# Patient Record
Sex: Male | Born: 1995 | Race: Black or African American | Hispanic: No | Marital: Single | Smoking: Former smoker
Health system: Southern US, Community
[De-identification: ages and names within clinical notes are randomized; demographics above are authoritative.]

## PROBLEM LIST (undated history)

## (undated) DIAGNOSIS — J189 Pneumonia, unspecified organism: Secondary | ICD-10-CM

## (undated) DIAGNOSIS — K226 Gastro-esophageal laceration-hemorrhage syndrome: Secondary | ICD-10-CM

## (undated) HISTORY — DX: Gastro-esophageal laceration-hemorrhage syndrome: K22.6

---
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
## 2016-07-29 ENCOUNTER — Encounter (HOSPITAL_COMMUNITY): Payer: Self-pay

## 2016-07-29 ENCOUNTER — Emergency Department (HOSPITAL_COMMUNITY)
Admission: EM | Admit: 2016-07-29 | Discharge: 2016-07-29 | Disposition: A | Discharge status: Home / Self Care | Payer: Medicaid Other | Attending: Emergency Medicine | Admitting: Emergency Medicine

## 2016-07-29 DIAGNOSIS — K648 Other hemorrhoids: Secondary | ICD-10-CM | POA: Diagnosis not present

## 2016-07-29 DIAGNOSIS — K6289 Other specified diseases of anus and rectum: Secondary | ICD-10-CM | POA: Diagnosis present

## 2016-07-29 NOTE — Discharge Instructions (Signed)
It was my pleasure taking care of you today!   Increase hydration and follow a high fiber diet. Use a stool softener such as Colace to help relieve pain with bowel movements. Sitz baths and Epsom salt baths will also help improve symptoms.   If symptoms persist, please call the surgery clinic listed to schedule a follow up appointment. Please return to ER for new or worsening symptoms, any additional concerns.

## 2016-07-29 NOTE — ED Triage Notes (Signed)
Patient complains of hemorrhoidal pain x 2 weeks with bowel movement, states that they are internal. No bleeding, no constipation, NAD
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
## 2016-07-29 NOTE — ED Provider Notes (Signed)
[facility]-EMERGENCY DEPT Provider Note   CSN: [id number] Arrival date & time: 07/29/16  4098  By signing my name below, I, Joshua Leon, attest that this documentation has been prepared under the direction and in the presence of [facility], PA-C.  Electronically Signed: Rosario Leon, ED Scribe. 07/29/16. 10:52 AM.  History   Chief Complaint Chief Complaint  Patient presents with  . Hemorrhoids   The history is provided by the patient. No language interpreter was used.    HPI Comments: Joshua Leon is a 21 y.o. male with no pertinent PMHx, who presents to the Emergency Department complaining of intermittent, gradually worsening rectal pain beginning two weeks ago. Pt reports that he first noticed his pain two weeks ago during a bowel movement. Pt does not have a h/o hemorrhoids; however, was seen at the health department where they informed him that this may possibly be related to this. During this previous encounter he did have full STD check including rectal swab which was all normal at that time per pt. He has been taking Tylenol with temporary relief of his pain. He has also been using stool softeners with improvement of his pain with BM's as well. His pain is exacerbated with bowel movements, but there have not been any recent change in bowel habits. He has not had to increase in strain to pass stool since the onset of his symptoms. Pt denies hematochezia, anal bleeding, fever, or any other associated symptoms.   History reviewed. No pertinent past medical history.  There are no active problems to display for this patient.  History reviewed. No pertinent surgical history.  Home Medications    Prior to Admission medications   Medication Sig Start Date End Date Taking? Authorizing Provider  clotrimazole (LOTRIMIN) 1 % cream Apply to affected area 3 times daily 02/06/15   Lowanda Foster, NP  cyclobenzaprine (FLEXERIL) 10 MG tablet Take 1 tablet (10 mg total) by mouth 2 (two) times  daily as needed for muscle spasms. 07/31/15   Nelva Nay, MD  HYDROcodone-acetaminophen (NORCO/VICODIN) 5-325 MG tablet Take 2 tablets by mouth every 4 (four) hours as needed. 07/31/15   Nelva Nay, MD  ondansetron (ZOFRAN) 4 MG tablet Take 1-2 tablets (4-8 mg total) by mouth every 8 (eight) hours as needed for nausea or vomiting. 03/25/15   Ozella Rocks, MD   Family History Family History  Problem Relation Age of Onset  . Cancer Neg Hx   . Diabetes Neg Hx   . Heart failure Neg Hx   . Hyperlipidemia Neg Hx    Social History Social History  Substance Use Topics  . Smoking status: Never Smoker  . Smokeless tobacco: Never Used  . Alcohol use Yes     Comment: ocassionally   Allergies   Patient has no known allergies.  Review of Systems Review of Systems  Constitutional: Negative for fever.  Gastrointestinal: Positive for rectal pain. Negative for anal bleeding, blood in stool, constipation and diarrhea.   Physical Exam Updated Vital Signs BP 150/78   Pulse (!) 57   Temp 98.6 F (37 C) (Oral)   Resp 18   SpO2 97%   Physical Exam  Constitutional: He is oriented to person, place, and time. He appears well-developed and well-nourished. No distress.  HENT:  Head: Normocephalic and atraumatic.  Cardiovascular: Normal rate, regular rhythm and normal heart sounds.   No murmur heard. Pulmonary/Chest: Effort normal and breath sounds normal. No respiratory distress.  Abdominal: Soft. He exhibits no  distension. There is no tenderness.  Genitourinary:  Genitourinary Comments: Chaperone present throughout entire exam. +internal hemorrhoid, no signs of anal fissures or other lesions.   Musculoskeletal: He exhibits no edema.  Neurological: He is alert and oriented to person, place, and time.  Skin: Skin is warm and dry.  Nursing note and vitals reviewed.  ED Treatments / Results  DIAGNOSTIC STUDIES: Oxygen Saturation is 98% on RA, normal by my interpretation.   COORDINATION  OF CARE: 10:52 AM-Discussed next steps with pt. Pt verbalized understanding and is agreeable with the plan.   Labs (all labs ordered are listed, but only abnormal results are displayed) Labs Reviewed - No data to display  EKG  EKG Interpretation None      Radiology No results found.  Procedures Procedures   Medications Ordered in ED Medications - No data to display  Initial Impression / Assessment and Plan / ED Course  I have reviewed the triage vital signs and the nursing notes.  Pertinent labs & imaging results that were available during my care of the patient were reviewed by me and considered in my medical decision making (see chart for details).    Joshua HurstDana Leon is a 21 y.o. male who presents to ED for Rectal pain related to internal hemorrhoid. Internal hemorrhoid noted on exam. Patient has already had prior full STD screening two weeks ago including rectal swab which was all normal per patient. Home care instructions including sitz baths, continue stool softener discussed with patient at bedside. General surgery follow-up if symptoms persist despite symptomatic care. Return precautions discussed and all questions answered.  Final Clinical Impressions(s) / ED Diagnoses   Final diagnoses:  Internal hemorrhoid   New Prescriptions Discharge Medication List as of 07/29/2016 10:56 AM     I personally performed the services described in this documentation, which was scribed in my presence. The recorded information has been reviewed and is accurate.     [facility] Cottage HospitalJaime Pilcher Ward, PA-C 07/29/16 1125    Linwood DibblesJon Knapp, MD 07/29/16 [phone]

## 2016-11-23 ENCOUNTER — Emergency Department (HOSPITAL_COMMUNITY)
Admission: EM | Admit: 2016-11-23 | Discharge: 2016-11-23 | Disposition: A | Discharge status: Home / Self Care | Payer: Medicaid Other | Attending: Emergency Medicine | Admitting: Emergency Medicine

## 2016-11-23 ENCOUNTER — Emergency Department (HOSPITAL_COMMUNITY): Payer: Medicaid Other

## 2016-11-23 ENCOUNTER — Encounter (HOSPITAL_COMMUNITY): Payer: Self-pay | Admitting: Emergency Medicine

## 2016-11-23 DIAGNOSIS — S3992XA Unspecified injury of lower back, initial encounter: Secondary | ICD-10-CM | POA: Diagnosis present

## 2016-11-23 DIAGNOSIS — T304 Corrosion of unspecified body region, unspecified degree: Secondary | ICD-10-CM | POA: Diagnosis not present

## 2016-11-23 DIAGNOSIS — Y9389 Activity, other specified: Secondary | ICD-10-CM | POA: Diagnosis not present

## 2016-11-23 DIAGNOSIS — Y92410 Unspecified street and highway as the place of occurrence of the external cause: Secondary | ICD-10-CM | POA: Diagnosis not present

## 2016-11-23 DIAGNOSIS — Y999 Unspecified external cause status: Secondary | ICD-10-CM | POA: Diagnosis not present

## 2016-11-23 DIAGNOSIS — S161XXA Strain of muscle, fascia and tendon at neck level, initial encounter: Secondary | ICD-10-CM | POA: Diagnosis not present

## 2016-11-23 DIAGNOSIS — S39012A Strain of muscle, fascia and tendon of lower back, initial encounter: Secondary | ICD-10-CM | POA: Diagnosis not present

## 2016-11-23 MED ORDER — HYDROCODONE-ACETAMINOPHEN 5-325 MG PO TABS: ORAL_TABLET | ORAL | 0 refills | Status: DC

## 2016-11-23 MED ORDER — LIDOCAINE VISCOUS 2 % MT SOLN: OROMUCOSAL | 0 refills | Status: DC

## 2016-11-23 MED ORDER — BACITRACIN ZINC 500 UNIT/GM EX OINT
1.0000 "application " | TOPICAL_OINTMENT | Freq: Once | CUTANEOUS | Status: AC
  Administered 2016-11-23: 1 via TOPICAL
  Filled 2016-11-23: qty 0.9

## 2016-11-23 MED ORDER — HYDROCODONE-ACETAMINOPHEN 5-325 MG PO TABS
1.0000 | ORAL_TABLET | Freq: Once | ORAL | Status: AC
  Administered 2016-11-23: 1 via ORAL
  Filled 2016-11-23: qty 1

## 2016-11-23 MED ORDER — LIDOCAINE VISCOUS 2 % MT SOLN
20.0000 mL | Freq: Once | OROMUCOSAL | Status: AC
  Administered 2016-11-23: 15 mL via OROMUCOSAL
  Filled 2016-11-23: qty 30
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
## 2016-11-23 NOTE — Discharge Instructions (Signed)
Take vicodin for breakthrough pain, do not drink alcohol, drive, care for children or do other critical tasks while taking vicodin. ° °Please follow with your primary care doctor in the next 2 days for a check-up. They must obtain records for further management.  ° °Do not hesitate to return to the Emergency Department for any new, worsening or concerning symptoms.  ° °
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
## 2017-02-17 ENCOUNTER — Emergency Department (HOSPITAL_COMMUNITY)
Admission: EM | Admit: 2017-02-17 | Discharge: 2017-02-17 | Discharge status: Against Medical Advice | Payer: Medicaid Other | Attending: Emergency Medicine | Admitting: Emergency Medicine

## 2017-02-17 ENCOUNTER — Encounter (HOSPITAL_COMMUNITY): Payer: Self-pay | Admitting: Emergency Medicine

## 2017-02-17 DIAGNOSIS — Z5321 Procedure and treatment not carried out due to patient leaving prior to being seen by health care provider: Secondary | ICD-10-CM | POA: Insufficient documentation

## 2017-02-17 DIAGNOSIS — R197 Diarrhea, unspecified: Secondary | ICD-10-CM | POA: Diagnosis present

## 2017-02-17 LAB — COMPREHENSIVE METABOLIC PANEL
ALT: 21 U/L (ref 17–63)
ANION GAP: 9 (ref 5–15)
AST: 22 U/L (ref 15–41)
Albumin: 4.7 g/dL (ref 3.5–5.0)
Alkaline Phosphatase: 69 U/L (ref 38–126)
BUN: 9 mg/dL (ref 6–20)
CALCIUM: 9.4 mg/dL (ref 8.9–10.3)
CHLORIDE: 101 mmol/L (ref 101–111)
CO2: 27 mmol/L (ref 22–32)
Creatinine, Ser: 1.06 mg/dL (ref 0.61–1.24)
GFR calc Af Amer: >60 mL/min (ref >60)
Glucose, Bld: 91 mg/dL (ref 65–99)
POTASSIUM: 3.4 mmol/L — AB (ref 3.5–5.1)
Sodium: 137 mmol/L (ref 135–145)
Total Bilirubin: 1.4 mg/dL — ABNORMAL HIGH (ref 0.3–1.2)
Total Protein: 8.6 g/dL — ABNORMAL HIGH (ref 6.5–8.1)

## 2017-02-17 LAB — CBC
HCT: 47.3 % (ref 39.0–52.0)
HEMOGLOBIN: 16.6 g/dL (ref 13.0–17.0)
MCH: 29.6 pg (ref 26.0–34.0)
MCHC: 35.1 g/dL (ref 30.0–36.0)
MCV: 84.3 fL (ref 78.0–100.0)
Platelets: 188 10*3/uL (ref 150–400)
RBC: 5.61 MIL/uL (ref 4.22–5.81)
RDW: 12.8 % (ref 11.5–15.5)
WBC: 8.7 10*3/uL (ref 4.0–10.5)

## 2017-02-17 LAB — LIPASE, BLOOD: LIPASE: 16 U/L (ref 11–51)

## 2017-02-17 NOTE — ED Triage Notes (Signed)
Pt reports lower abd pain for the past day. Pt thinks he had 1 episode of diarrhea. Felt nauseated earlier, but did not throw up because nothing was on his stomach.

## 2017-02-17 NOTE — ED Notes (Signed)
Pt called from lobby with no response. 

## 2018-01-09 ENCOUNTER — Emergency Department (HOSPITAL_COMMUNITY)
Admission: EM | Admit: 2018-01-09 | Discharge: 2018-01-09 | Disposition: A | Discharge status: Home / Self Care | Payer: No Typology Code available for payment source | Attending: Emergency Medicine | Admitting: Emergency Medicine

## 2018-01-09 ENCOUNTER — Emergency Department (HOSPITAL_COMMUNITY): Payer: No Typology Code available for payment source

## 2018-01-09 ENCOUNTER — Other Ambulatory Visit: Payer: Self-pay

## 2018-01-09 DIAGNOSIS — M542 Cervicalgia: Secondary | ICD-10-CM | POA: Diagnosis not present

## 2018-01-09 DIAGNOSIS — M546 Pain in thoracic spine: Secondary | ICD-10-CM

## 2018-01-09 DIAGNOSIS — R0789 Other chest pain: Secondary | ICD-10-CM

## 2018-01-09 DIAGNOSIS — Z041 Encounter for examination and observation following transport accident: Secondary | ICD-10-CM | POA: Diagnosis present

## 2018-01-09 MED ORDER — IBUPROFEN 200 MG PO TABS
600.0000 mg | ORAL_TABLET | Freq: Once | ORAL | Status: AC
  Administered 2018-01-09: 600 mg via ORAL
  Filled 2018-01-09: qty 3

## 2018-01-09 MED ORDER — METHOCARBAMOL 500 MG PO TABS: 500.0000 mg | ORAL_TABLET | Freq: Two times a day (BID) | ORAL | 0 refills | Status: DC

## 2018-01-09 MED ORDER — IBUPROFEN 600 MG PO TABS: 600.0000 mg | ORAL_TABLET | Freq: Four times a day (QID) | ORAL | 0 refills | Status: DC | PRN
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
## 2018-01-09 NOTE — Discharge Instructions (Signed)
Your imaging looks good today and shows no evidence of fracture.  The pain your experiencing is likely due to muscle strain, you may take Ibuprofen and Robaxin as needed for pain management. Do not combine with any pain reliever other than tylenol. The muscle soreness should improve over the next week. Follow up with your family doctor in the next week for a recheck if you are still having symptoms. Return to ED if pain is worsening, you develop weakness or numbness of extremities, or new or concerning symptoms develop.

## 2018-04-02 ENCOUNTER — Emergency Department (HOSPITAL_COMMUNITY): Payer: Medicaid Other

## 2018-04-02 ENCOUNTER — Other Ambulatory Visit: Payer: Self-pay

## 2018-04-02 ENCOUNTER — Encounter (HOSPITAL_COMMUNITY): Payer: Self-pay

## 2018-04-02 ENCOUNTER — Emergency Department (HOSPITAL_COMMUNITY)
Admission: EM | Admit: 2018-04-02 | Discharge: 2018-04-02 | Disposition: A | Discharge status: Home / Self Care | Payer: Medicaid Other | Attending: Emergency Medicine | Admitting: Emergency Medicine

## 2018-04-02 DIAGNOSIS — J189 Pneumonia, unspecified organism: Secondary | ICD-10-CM

## 2018-04-02 DIAGNOSIS — Z79899 Other long term (current) drug therapy: Secondary | ICD-10-CM | POA: Insufficient documentation

## 2018-04-02 DIAGNOSIS — J181 Lobar pneumonia, unspecified organism: Secondary | ICD-10-CM | POA: Diagnosis not present

## 2018-04-02 DIAGNOSIS — R05 Cough: Secondary | ICD-10-CM | POA: Diagnosis present

## 2018-04-02 MED ORDER — IBUPROFEN 200 MG PO TABS
600.0000 mg | ORAL_TABLET | Freq: Once | ORAL | Status: AC
  Administered 2018-04-02: 600 mg via ORAL
  Filled 2018-04-02: qty 3

## 2018-04-02 MED ORDER — AMOXICILLIN 500 MG PO CAPS: 1000.0000 mg | ORAL_CAPSULE | Freq: Three times a day (TID) | ORAL | 0 refills | Status: AC

## 2018-04-02 NOTE — ED Notes (Signed)
ED Provider at bedside. 

## 2018-04-02 NOTE — Discharge Instructions (Signed)
There is evidence of pneumonia on the chest x-ray. Please take all of your antibiotics until finished!   You may develop abdominal discomfort or diarrhea from the antibiotic.  You may help offset this with probiotics which you can buy or get in yogurt. Do not eat or take the probiotics until 2 hours after your antibiotic.   Antiinflammatory medications: Take 600 mg of ibuprofen every 6 hours or 440 mg (over the counter dose) to 500 mg (prescription dose) of naproxen every 12 hours for the next 3 days. After this time, these medications may be used as needed for pain. Take these medications with food to avoid upset stomach. Choose only one of these medications, do not take them together. Acetaminophen (generic for Tylenol): Should you continue to have additional pain while taking the ibuprofen or naproxen, you may add in acetaminophen as needed. Your daily total maximum amount of acetaminophen from all sources should be limited to 4000mg /day for persons without liver problems, or 2000mg /day for those with liver problems.  It is common for you to continue to have a cough following resolution of pneumonia, therefore it is recommended that you have a repeat chest x-ray in the next couple weeks after finishing the antibiotic.  Return to the ED for shortness of breath, increased chest pain, or any other major concerns.
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
## 2018-04-02 NOTE — ED Provider Notes (Signed)
[facility]   CSN: [id number] Arrival date & time: 04/02/18  7829     History   Chief Complaint Chief Complaint  Patient presents with  . Muscle Pain  . Cough    HPI Joshua Leon is a 22 y.o. male.  HPI  Joshua Leon is a 22 y.o. male, patient with no pertinent past medical history, presenting to the ED with chest tightness beginning last night.  Tightness is in the right upper chest, last night was 10/10, has improved to 6/10 today, nonradiating.  Also notes he has had productive cough for the last week.  Subjective fever along with chills. Denies nausea/vomiting, diarrhea, shortness of breath, abdominal pain, or any other complaints.     History reviewed. No pertinent past medical history.  There are no active problems to display for this patient.   History reviewed. No pertinent surgical history.      Home Medications    Prior to Admission medications   Medication Sig Start Date End Date Taking? Authorizing Provider  ibuprofen (ADVIL,MOTRIN) 600 MG tablet Take 1 tablet (600 mg total) by mouth every 6 (six) hours as needed. 01/09/18  Yes Dartha Lodge, PA-C  methocarbamol (ROBAXIN) 500 MG tablet Take 1 tablet (500 mg total) by mouth 2 (two) times daily. 01/09/18  Yes Dartha Lodge, PA-C  amoxicillin (AMOXIL) 500 MG capsule Take 2 capsules (1,000 mg total) by mouth 3 (three) times daily for 5 days. 04/02/18 04/07/18  Massai Hankerson, Hillard Danker, PA-C  clotrimazole (LOTRIMIN) 1 % cream Apply to affected area 3 times daily Patient not taking: Reported on 01/09/2018 02/06/15   Lowanda Foster, NP  HYDROcodone-acetaminophen (NORCO/VICODIN) 5-325 MG tablet Take 1-2 tablets by mouth every 6 hours as needed for pain and/or cough. Patient not taking: Reported on 01/09/2018 11/23/16   Pisciotta, Joni Reining, PA-C  lidocaine (XYLOCAINE) 2 % solution Mixed 10 mL with bacitracin or Neosporin and applied to the forearm every 4-6 hours as needed for pain Patient  not taking: Reported on 01/09/2018 11/23/16   Pisciotta, Joni Reining, PA-C  ondansetron (ZOFRAN) 4 MG tablet Take 1-2 tablets (4-8 mg total) by mouth every 8 (eight) hours as needed for nausea or vomiting. Patient not taking: Reported on 01/09/2018 03/25/15   Ozella Rocks, MD    Family History Family History  Problem Relation Age of Onset  . Cancer Neg Hx   . Diabetes Neg Hx   . Heart failure Neg Hx   . Hyperlipidemia Neg Hx     Social History Social History   Tobacco Use  . Smoking status: Never Smoker  . Smokeless tobacco: Never Used  Substance Use Topics  . Alcohol use: Yes    Comment: ocassionally  . Drug use: Yes    Types: Marijuana     Allergies   Patient has no known allergies.   Review of Systems Review of Systems  Constitutional: Positive for chills and fever (subjective).  Respiratory: Positive for cough and chest tightness. Negative for shortness of breath.   Cardiovascular: Negative for leg swelling.  Gastrointestinal: Negative for abdominal pain, diarrhea, nausea and vomiting.  Musculoskeletal: Negative for back pain.  All other systems reviewed and are negative.    Physical Exam Updated Vital Signs BP 126/81 (BP Location: Left Arm)   Pulse 88   Temp 98.3 F (36.8 C) (Oral)   Resp 18   Ht 5\' 9"  (1.753 m)   Wt 88.5 kg   SpO2 98%   BMI 28.80  kg/m   Physical Exam  Constitutional: He appears well-developed and well-nourished. No distress.  HENT:  Head: Normocephalic and atraumatic.  Eyes: Conjunctivae are normal.  Neck: Neck supple.  Cardiovascular: Normal rate, regular rhythm, normal heart sounds and intact distal pulses.  Pulmonary/Chest: Effort normal and breath sounds normal. No respiratory distress.  No increased work of breathing.  Speaks in full sentences without difficulty.    Abdominal: Soft. There is no tenderness. There is no guarding.  Musculoskeletal: He exhibits no edema.  Lymphadenopathy:    He has no cervical adenopathy.    Neurological: He is alert.  Skin: Skin is warm and dry. He is not diaphoretic.  Psychiatric: He has a normal mood and affect. His behavior is normal.  Nursing note and vitals reviewed.    ED Treatments / Results  Labs (all labs ordered are listed, but only abnormal results are displayed) Labs Reviewed - No data to display  EKG None  Radiology Dg Chest 2 View  Result Date: 04/02/2018 CLINICAL DATA:  One week of cough.  Current smoker. EXAM: CHEST - 2 VIEW COMPARISON:  Chest x-ray of January 09, 2018 FINDINGS: There is a new area of alveolar opacity with faint air bronchograms in the right suprahilar region. The remainder of the right lung is clear as is the left lung. The heart and pulmonary vascularity are normal. The mediastinum is normal in width. There is no pleural effusion. The bony thorax exhibits no acute abnormality. IMPRESSION: Probable pneumonia in the right suprahilar region anteriorly. Follow-up radiographs following anticipated antibiotic therapy are recommended unless the patient's symptoms completely resolved. Electronically Signed   By: David  [place] M.D.   On: 04/02/2018 12:11    Procedures Procedures (including critical care time)  Medications Ordered in ED Medications  ibuprofen (ADVIL,MOTRIN) tablet 600 mg (600 mg Oral Given 04/02/18 1141)     Initial Impression / Assessment and Plan / ED Course  I have reviewed the triage vital signs and the nursing notes.  Pertinent labs & imaging results that were available during my care of the patient were reviewed by me and considered in my medical decision making (see chart for details).     Patient presents with an episode of chest tightness.  This has since improved.  His chest tightness was preceded by a week of coughing. Patient is nontoxic appearing, afebrile, not tachycardic, not tachypneic, not hypotensive, maintains excellent SPO2 on room air, and is in no apparent distress.  Evidence of pneumonia in the right  lung.  Patient does not have any known comorbidities. The patient was given instructions for home care as well as return precautions. Patient voices understanding of these instructions, accepts the plan, and is comfortable with discharge.   Vitals:   04/02/18 0932 04/02/18 0940 04/02/18 1140  BP: 126/81  119/73  Pulse: 88  65  Resp: 18  18  Temp: 98.3 F (36.8 C)    TempSrc: Oral    SpO2: 98%  100%  Weight:  88.5 kg   Height:  5\' 9"  (1.753 m)      Final Clinical Impressions(s) / ED Diagnoses   Final diagnoses:  Community acquired pneumonia of right middle lobe of lung [facility])    ED Discharge Orders         Ordered    amoxicillin (AMOXIL) 500 MG capsule  3 times daily     04/02/18 1251           Meliya Mcconahy, Ines Bloomer C, PA-C 04/02/18 1255  Benjiman Core, MD 04/02/18 [phone]
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
## 2019-01-24 ENCOUNTER — Other Ambulatory Visit: Payer: Self-pay

## 2019-01-24 ENCOUNTER — Encounter (HOSPITAL_COMMUNITY): Payer: Self-pay | Admitting: *Deleted

## 2019-01-24 ENCOUNTER — Emergency Department (HOSPITAL_COMMUNITY)
Admission: EM | Admit: 2019-01-24 | Discharge: 2019-01-24 | Disposition: A | Discharge status: Home / Self Care | Payer: Medicaid Other | Attending: Emergency Medicine | Admitting: Emergency Medicine

## 2019-01-24 DIAGNOSIS — Y999 Unspecified external cause status: Secondary | ICD-10-CM | POA: Insufficient documentation

## 2019-01-24 DIAGNOSIS — Y929 Unspecified place or not applicable: Secondary | ICD-10-CM | POA: Insufficient documentation

## 2019-01-24 DIAGNOSIS — S29012A Strain of muscle and tendon of back wall of thorax, initial encounter: Secondary | ICD-10-CM | POA: Insufficient documentation

## 2019-01-24 DIAGNOSIS — Y939 Activity, unspecified: Secondary | ICD-10-CM | POA: Insufficient documentation

## 2019-01-24 MED ORDER — ACETAMINOPHEN 500 MG PO TABS
1000.0000 mg | ORAL_TABLET | Freq: Once | ORAL | Status: AC
  Administered 2019-01-24: 04:00:00 1000 mg via ORAL
  Filled 2019-01-24: qty 2
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
## 2019-01-24 NOTE — Discharge Instructions (Addendum)
You may use over-the-counter Motrin (Ibuprofen), Acetaminophen (Tylenol), topical muscle creams such as SalonPas, Icy Hot, Bengay, etc. Please stretch, apply heat, and have massage therapy for additional assistance. ° °
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
## 2020-06-23 ENCOUNTER — Other Ambulatory Visit: Payer: Self-pay

## 2020-06-23 ENCOUNTER — Emergency Department (HOSPITAL_COMMUNITY): Payer: Self-pay

## 2020-06-23 ENCOUNTER — Encounter (HOSPITAL_COMMUNITY): Payer: Self-pay

## 2020-06-23 ENCOUNTER — Emergency Department (HOSPITAL_COMMUNITY)
Admission: EM | Admit: 2020-06-23 | Discharge: 2020-06-23 | Disposition: A | Discharge status: Home / Self Care | Payer: Self-pay | Attending: Emergency Medicine | Admitting: Emergency Medicine

## 2020-06-23 DIAGNOSIS — Z5321 Procedure and treatment not carried out due to patient leaving prior to being seen by health care provider: Secondary | ICD-10-CM | POA: Insufficient documentation

## 2020-06-23 DIAGNOSIS — R079 Chest pain, unspecified: Secondary | ICD-10-CM | POA: Insufficient documentation

## 2020-06-23 HISTORY — DX: Pneumonia, unspecified organism: J18.9

## 2020-06-23 LAB — BASIC METABOLIC PANEL
Anion gap: 7 (ref 5–15)
BUN: 9 mg/dL (ref 6–20)
CO2: 29 mmol/L (ref 22–32)
Calcium: 9.3 mg/dL (ref 8.9–10.3)
Chloride: 104 mmol/L (ref 98–111)
Creatinine, Ser: 0.85 mg/dL (ref 0.61–1.24)
GFR, Estimated: >60 mL/min (ref >60)
Glucose, Bld: 80 mg/dL (ref 70–99)
Potassium: 4.1 mmol/L (ref 3.5–5.1)
Sodium: 140 mmol/L (ref 135–145)

## 2020-06-23 LAB — CBC
HCT: 50.4 % (ref 39.0–52.0)
Hemoglobin: 16.6 g/dL (ref 13.0–17.0)
MCH: 29.1 pg (ref 26.0–34.0)
MCHC: 32.9 g/dL (ref 30.0–36.0)
MCV: 88.3 fL (ref 80.0–100.0)
Platelets: 169 10*3/uL (ref 150–400)
RBC: 5.71 MIL/uL (ref 4.22–5.81)
RDW: 13.2 % (ref 11.5–15.5)
WBC: 3.8 10*3/uL — ABNORMAL LOW (ref 4.0–10.5)
nRBC: 0 % (ref 0.0–0.2)

## 2020-06-23 LAB — TROPONIN I (HIGH SENSITIVITY): Troponin I (High Sensitivity): 4 ng/L (ref <18)

## 2020-06-23 NOTE — ED Triage Notes (Signed)
Patient reports that he began having intermittent left chest pain since wakening at 0630. Patient reports that he had palpitations 2 days ago.

## 2021-04-28 ENCOUNTER — Telehealth: Payer: Self-pay

## 2021-04-28 ENCOUNTER — Encounter: Payer: Self-pay | Admitting: Internal Medicine

## 2021-04-28 ENCOUNTER — Other Ambulatory Visit (HOSPITAL_COMMUNITY): Payer: Self-pay

## 2021-04-28 ENCOUNTER — Ambulatory Visit (INDEPENDENT_AMBULATORY_CARE_PROVIDER_SITE_OTHER): Payer: Self-pay | Admitting: Pharmacist

## 2021-04-28 ENCOUNTER — Other Ambulatory Visit: Payer: Self-pay

## 2021-04-28 ENCOUNTER — Other Ambulatory Visit: Payer: Self-pay | Admitting: Pharmacist

## 2021-04-28 ENCOUNTER — Ambulatory Visit: Payer: Self-pay

## 2021-04-28 ENCOUNTER — Ambulatory Visit (INDEPENDENT_AMBULATORY_CARE_PROVIDER_SITE_OTHER): Payer: Self-pay | Admitting: Internal Medicine

## 2021-04-28 VITALS — BP 129/82 | HR 76 | Temp 98.4°F | Wt 208.0 lb

## 2021-04-28 DIAGNOSIS — B20 Human immunodeficiency virus [HIV] disease: Secondary | ICD-10-CM

## 2021-04-28 DIAGNOSIS — Z113 Encounter for screening for infections with a predominantly sexual mode of transmission: Secondary | ICD-10-CM | POA: Insufficient documentation

## 2021-04-28 DIAGNOSIS — Z23 Encounter for immunization: Secondary | ICD-10-CM

## 2021-04-28 MED ORDER — BICTEGRAVIR-EMTRICITAB-TENOFOV 50-200-25 MG PO TABS: 1.0000 | ORAL_TABLET | Freq: Every day | ORAL | 11 refills | Status: DC

## 2021-04-28 MED ORDER — BICTEGRAVIR-EMTRICITAB-TENOFOV 50-200-25 MG PO TABS: 1.0000 | ORAL_TABLET | Freq: Every day | ORAL | 0 refills | Status: AC

## 2021-04-28 NOTE — Progress Notes (Signed)
Patient ID: Joshua Mitchelle., male    DOB: 05-07-1996, 25 y.o.   MRN: [id number]  Reason for visit: to establish care as a new patient with HIV  HPI:   Patient was first diagnosed last month during routine screening at the health department.  Previously tested negative in August 2022.  No labs yet done and he is here for consideration of rapid start.  He is having no symptoms, no weight loss, no diarrhea.  He endorses sex with men and women.  He has a history of gonorrohea, chlamydia and syphilis.  No current penile discharge, no rash/lesions.  He is still in shock of his diagnosis.  He endorses good condom use so surprised of his diagnosis.  He has trouble swallowing pills but will be able to take medication that is necessary.   Past Medical History:  Diagnosis Date   Pneumonia     Prior to Admission medications   Medication Sig Start Date End Date Taking? Authorizing Provider  bictegravir-emtricitabine-tenofovir AF (BIKTARVY) 50-200-25 MG TABS tablet Take 1 tablet by mouth daily. 04/28/21  Yes Sarath Privott, Joshua Heman, MD  bictegravir-emtricitabine-tenofovir AF (BIKTARVY) 50-200-25 MG TABS tablet Take 1 tablet by mouth daily for 14 days. 04/28/21 05/12/21  Jennette Kettle, RPH-CPP    No Known Allergies  Social History   Tobacco Use   Smoking status: Some Days    Types: Cigars   Smokeless tobacco: Never  Vaping Use   Vaping Use: Never used  Substance Use Topics   Alcohol use: Yes    Comment: ocassionally   Drug use: Yes    Types: Marijuana    Family History  Problem Relation Age of Onset   Graves' disease Mother    Cancer Neg Hx    Diabetes Neg Hx    Heart failure Neg Hx    Hyperlipidemia Neg Hx     Review of Systems Constitutional: negative for sweats, fatigue, malaise, anorexia, and weight loss Respiratory: negative for dyspnea on exertion Gastrointestinal: negative for nausea and diarrhea Integument/breast: negative for rash Musculoskeletal: negative for myalgias and  arthralgias All other systems reviewed and are negative    CONSTITUTIONAL:in no apparent distress  Vitals:   04/28/21 1021  BP: 129/82  Pulse: 76  Temp: 98.4 F (36.9 C)   EYES: anicteric HENT: no thrush CARD:Cor RRR RESP:clear WU:JWJX, nt MS:no pedal edema noted SKIN:no rash NEURO: non-focal  No results found for: HIV1RNAQUANT No components found for: HIV1GENOTYPRPLUS No components found for: THELPERCELL  Assessment: new patient here with HIV.  Discussed with patient treatment options and side effects, benefits of treatment, long term outcomes.  I discussed the severity of untreated HIV including higher cancer risk, opportunistic infections, renal failure.  Also discussed needing to use condoms, partner disclosure, necessary vaccines, blood monitoring.  All questions answered.    Plan: 1)  labs today  2) start Biktarvy via rapid start 3) menveo, PCV 13 and flu shot today. 4) counseling offered Follow up in 2-3 weeks

## 2021-04-28 NOTE — Progress Notes (Signed)
HPI: Joshua Leon. is a 25 y.o. male who presents to the [facility] clinic today to initiate care for a newly diagnosed HIV infection.  Patient Active Problem List   Diagnosis Date Noted   Human immunodeficiency virus (HIV) disease (HCC) 04/28/2021   Screening examination for venereal disease 04/28/2021    Patient's Medications  New Prescriptions   No medications on file  Previous Medications   BICTEGRAVIR-EMTRICITABINE-TENOFOVIR AF (BIKTARVY) 50-200-25 MG TABS TABLET    Take 1 tablet by mouth daily.  Modified Medications   No medications on file  Discontinued Medications   No medications on file    Allergies: No Known Allergies  Past Medical History: Past Medical History:  Diagnosis Date   Pneumonia     Social History: Social History   Socioeconomic History   Marital status: Single    Spouse name: Not on file   Number of children: Not on file   Years of education: Not on file   Highest education level: Not on file  Occupational History   Not on file  Tobacco Use   Smoking status: Some Days    Types: Cigars   Smokeless tobacco: Never  Vaping Use   Vaping Use: Never used  Substance and Sexual Activity   Alcohol use: Yes    Comment: ocassionally   Drug use: Yes    Types: Marijuana   Sexual activity: Not Currently    Partners: Female, Male    Comment: declined condoms  Other Topics Concern   Not on file  Social History Narrative   Not on file   Social Determinants of Health   Financial Resource Strain: Not on file  Food Insecurity: Not on file  Transportation Needs: Not on file  Physical Activity: Not on file  Stress: Not on file  Social Connections: Not on file    Labs: No results found for: HIV1RNAQUANT, HIV1RNAVL, CD4TABS  RPR and STI No results found for: LABRPR, RPRTITER  No flowsheet data found.  Hepatitis B No results found for: HEPBSAB, HEPBSAG, HEPBCAB Hepatitis C No results found for: HEPCAB, HCVRNAPCRQN Hepatitis A No  results found for: HAV Lipids: No results found for: CHOL, TRIG, HDL, CHOLHDL, VLDL, LDLCALC  Current HIV Regimen: Treatment naive  Assessment: Joshua Leon is here today to initiate care with Dr. Luciana Axe for his newly diagnosed HIV infection.  He is treatment naive with an unknown initial viral load or CD4 count; labs to be collected today. Will start patient on Biktarvy.  Explained that Joshua Leon is a one pill once daily medication with or without food and the importance of not missing any doses. Explained resistance and how it develops and why it is so important to take Biktarvy daily and not skip days or doses. Counseled patient to take it around the same time each day. Counseled on what to do if dose is missed, if closer to missed dose take immediately, if closer to next dose then skip and resume normal schedule. Cautioned on possible side effects the first week or so including nausea, diarrhea, dizziness, and headaches but that they should resolve after the first couple of weeks. I reviewed patient medications and found no drug interactions. Counseled patient to separate Biktarvy from divalent cations including multivitamins. Discussed with patient to call clinic if he starts a new medication or herbal supplement. I gave the patient my card and told him to call me with any issues/questions/concerns.   Plan: Start Joshua Leon  Counseled patient on Joshua Leon, PharmD,  CPP Clinical Pharmacist Practitioner Infectious Diseases Clinical Pharmacist [facility] 04/28/2021, 11:20 AM

## 2021-04-28 NOTE — Progress Notes (Signed)
Medication Samples have been provided to the patient.  Drug name: Biktarvy        Strength: 50/200/25 mg       Qty: 14 tablets (2 bottles) LOT: [id number]   Exp.Date: 10/24  Dosing instructions: Take one tablet by mouth once daily  The patient has been instructed regarding the correct time, dose, and frequency of taking this medication, including desired effects and most common side effects.   Falon Flinchum, PharmD, CPP Clinical Pharmacist Practitioner Infectious Diseases Clinical Pharmacist [facility]  

## 2021-04-28 NOTE — Telephone Encounter (Signed)
[facility] Patient Advocate Encounter ? ?Insurance verification completed.   ? ?The patient is uninsured and will need patient assistance for medication. ? ?We can complete the application and will need to meet with the patient for signatures and income documentation. ? ?Poetry Cerro, CPhT ?Specialty Pharmacy Patient Advocate ?[facility] ?Phone: [phone] ?Fax:  [phone]  ?

## 2021-05-01 ENCOUNTER — Encounter: Payer: Self-pay | Admitting: Internal Medicine

## 2021-05-01 LAB — CYTOLOGY, (ORAL, ANAL, URETHRAL) ANCILLARY ONLY
Chlamydia: NEGATIVE
Chlamydia: NEGATIVE
Comment: NEGATIVE
Comment: NEGATIVE
Comment: NORMAL
Comment: NORMAL
Neisseria Gonorrhea: NEGATIVE
Neisseria Gonorrhea: NEGATIVE

## 2021-05-07 LAB — CBC WITH DIFFERENTIAL/PLATELET
Absolute Monocytes: 490 cells/uL (ref 200–950)
Basophils Absolute: 41 cells/uL (ref 0–200)
Basophils Relative: 0.8 %
Eosinophils Absolute: 82 cells/uL (ref 15–500)
Eosinophils Relative: 1.6 %
HCT: 49.1 % (ref 38.5–50.0)
Hemoglobin: 16.6 g/dL (ref 13.2–17.1)
Lymphs Abs: 1892 cells/uL (ref 850–3900)
MCH: 29.9 pg (ref 27.0–33.0)
MCHC: 33.8 g/dL (ref 32.0–36.0)
MCV: 88.5 fL (ref 80.0–100.0)
MPV: 9.8 fL (ref 7.5–12.5)
Monocytes Relative: 9.6 %
Neutro Abs: 2596 cells/uL (ref 1500–7800)
Neutrophils Relative %: 50.9 %
Platelets: 193 10*3/uL (ref 140–400)
RBC: 5.55 10*6/uL (ref 4.20–5.80)
RDW: 13.1 % (ref 11.0–15.0)
Total Lymphocyte: 37.1 %
WBC: 5.1 10*3/uL (ref 3.8–10.8)

## 2021-05-07 LAB — LIPID PANEL
Cholesterol: 207 mg/dL — ABNORMAL HIGH (ref <200)
HDL: 62 mg/dL (ref >=40)
LDL Cholesterol (Calc): 120 mg/dL (calc) — ABNORMAL HIGH
Non-HDL Cholesterol (Calc): 145 mg/dL (calc) — ABNORMAL HIGH (ref <130)
Total CHOL/HDL Ratio: 3.3 (calc) (ref <5.0)
Triglycerides: 130 mg/dL (ref <150)

## 2021-05-07 LAB — COMPLETE METABOLIC PANEL WITH GFR
AG Ratio: 1.5 (calc) (ref 1.0–2.5)
ALT: 32 U/L (ref 9–46)
AST: 24 U/L (ref 10–40)
Albumin: 5.1 g/dL (ref 3.6–5.1)
Alkaline phosphatase (APISO): 83 U/L (ref 36–130)
BUN: 13 mg/dL (ref 7–25)
CO2: 31 mmol/L (ref 20–32)
Calcium: 9.9 mg/dL (ref 8.6–10.3)
Chloride: 100 mmol/L (ref 98–110)
Creat: 0.97 mg/dL (ref 0.60–1.24)
Globulin: 3.3 g/dL (calc) (ref 1.9–3.7)
Glucose, Bld: 79 mg/dL (ref 65–99)
Potassium: 3.8 mmol/L (ref 3.5–5.3)
Sodium: 138 mmol/L (ref 135–146)
Total Bilirubin: 0.9 mg/dL (ref 0.2–1.2)
Total Protein: 8.4 g/dL — ABNORMAL HIGH (ref 6.1–8.1)
eGFR: 112 mL/min/{1.73_m2} (ref >=60)

## 2021-05-07 LAB — HIV-1 RNA ULTRAQUANT REFLEX TO GENTYP+
HIV 1 RNA Quant: 2360 copies/mL — ABNORMAL HIGH
HIV-1 RNA Quant, Log: 3.37 Log copies/mL — ABNORMAL HIGH

## 2021-05-07 LAB — QUANTIFERON-TB GOLD PLUS
Mitogen-NIL: >10 IU/mL
NIL: 0.07 IU/mL
QuantiFERON-TB Gold Plus: NEGATIVE
TB1-NIL: <0 IU/mL
TB2-NIL: <0 IU/mL

## 2021-05-07 LAB — T-HELPER CELLS (CD4) COUNT (NOT AT ARMC)
Absolute CD4: 639 cells/uL (ref 490–1740)
CD4 T Helper %: 30 % (ref 30–61)
Total lymphocyte count: 2161 cells/uL (ref 850–3900)

## 2021-05-07 LAB — HEPATITIS B CORE ANTIBODY, TOTAL: Hep B Core Total Ab: NONREACTIVE

## 2021-05-07 LAB — HIV ANTIBODY (ROUTINE TESTING W REFLEX): HIV 1&2 Ab, 4th Generation: REACTIVE — AB

## 2021-05-07 LAB — HEPATITIS B SURFACE ANTIBODY,QUALITATIVE: Hep B S Ab: NONREACTIVE

## 2021-05-07 LAB — HIV-1/2 AB - DIFFERENTIATION
HIV-1 antibody: POSITIVE — AB
HIV-2 Ab: NEGATIVE

## 2021-05-07 LAB — HEPATITIS A ANTIBODY, TOTAL: Hepatitis A AB,Total: REACTIVE — AB

## 2021-05-07 LAB — HEPATITIS C ANTIBODY
Hepatitis C Ab: NONREACTIVE
SIGNAL TO CUT-OFF: 0.09 (ref <1.00)

## 2021-05-07 LAB — RPR: RPR Ser Ql: NONREACTIVE

## 2021-05-07 LAB — HIV-1 GENOTYPE: HIV-1 Genotype: DETECTED — AB

## 2021-05-07 LAB — HEPATITIS B SURFACE ANTIGEN: Hepatitis B Surface Ag: NONREACTIVE

## 2021-05-23 ENCOUNTER — Ambulatory Visit (INDEPENDENT_AMBULATORY_CARE_PROVIDER_SITE_OTHER): Payer: Self-pay | Admitting: Internal Medicine

## 2021-05-23 ENCOUNTER — Other Ambulatory Visit: Payer: Self-pay

## 2021-05-23 ENCOUNTER — Encounter: Payer: Self-pay | Admitting: Internal Medicine

## 2021-05-23 VITALS — BP 146/72 | HR 64 | Temp 97.7°F | Wt 196.6 lb

## 2021-05-23 DIAGNOSIS — B20 Human immunodeficiency virus [HIV] disease: Secondary | ICD-10-CM

## 2021-05-23 DIAGNOSIS — Z113 Encounter for screening for infections with a predominantly sexual mode of transmission: Secondary | ICD-10-CM

## 2021-05-23 DIAGNOSIS — Z23 Encounter for immunization: Secondary | ICD-10-CM

## 2021-05-23 DIAGNOSIS — Z5181 Encounter for therapeutic drug level monitoring: Secondary | ICD-10-CM

## 2021-05-24 ENCOUNTER — Encounter: Payer: Self-pay | Admitting: Internal Medicine

## 2021-05-24 DIAGNOSIS — Z5181 Encounter for therapeutic drug level monitoring: Secondary | ICD-10-CM | POA: Insufficient documentation

## 2021-05-24 DIAGNOSIS — Z23 Encounter for immunization: Secondary | ICD-10-CM | POA: Insufficient documentation

## 2021-05-24 LAB — COMPLETE METABOLIC PANEL WITH GFR
AG Ratio: 1.5 (calc) (ref 1.0–2.5)
ALT: 21 U/L (ref 9–46)
AST: 22 U/L (ref 10–40)
Albumin: 4.5 g/dL (ref 3.6–5.1)
Alkaline phosphatase (APISO): 61 U/L (ref 36–130)
BUN: 15 mg/dL (ref 7–25)
CO2: 29 mmol/L (ref 20–32)
Calcium: 9.2 mg/dL (ref 8.6–10.3)
Chloride: 102 mmol/L (ref 98–110)
Creat: 1.1 mg/dL (ref 0.60–1.24)
Globulin: 3 g/dL (calc) (ref 1.9–3.7)
Glucose, Bld: 89 mg/dL (ref 65–99)
Potassium: 4.2 mmol/L (ref 3.5–5.3)
Sodium: 138 mmol/L (ref 135–146)
Total Bilirubin: 1 mg/dL (ref 0.2–1.2)
Total Protein: 7.5 g/dL (ref 6.1–8.1)
eGFR: 96 mL/min/{1.73_m2} (ref >=60)

## 2021-05-24 LAB — T-HELPER CELLS (CD4) COUNT (NOT AT ARMC)
Absolute CD4: 617 cells/uL (ref 490–1740)
CD4 T Helper %: 28 % — ABNORMAL LOW (ref 30–61)
Total lymphocyte count: 2193 cells/uL (ref 850–3900)

## 2021-05-24 LAB — HIV-1 RNA QUANT-NO REFLEX-BLD
HIV 1 RNA Quant: NOT DETECTED Copies/mL
HIV-1 RNA Quant, Log: NOT DETECTED Log cps/mL

## 2021-05-24 NOTE — Progress Notes (Signed)
° °  Subjective:    Patient ID: Joshua Leon., male    DOB: July 20, 1995, 25 y.o.   MRN: [id number]  HPI Here for follow up of HIV This is his second visit after rapid start and he is on biktarvy.  He is having no issues and good tolerance of the medication.  His initial CD4 was 639 and viral load 2,360.  He has no complaints today and is still adjusting to the diagnosis.  No symptoms of weight loss.     Review of Systems  Constitutional:  Negative for fatigue.  Gastrointestinal:  Negative for diarrhea and nausea.  Skin:  Negative for rash.      Objective:   Physical Exam Eyes:     General: No scleral icterus. Cardiovascular:     Rate and Rhythm: Normal rate.  Pulmonary:     Effort: Pulmonary effort is normal.  Skin:    Findings: No rash.  Neurological:     General: No focal deficit present.     Mental Status: He is alert.  Psychiatric:        Mood and Affect: Mood normal.   SH: + tobacco       Assessment & Plan:

## 2021-05-24 NOTE — Assessment & Plan Note (Signed)
He is doing well on his regimen and will check the viral load today and anticipate a good decline.  He is doing well overall as he continues to adjust to the diagnosis.  Labs today and he will return in about 2 months to recheck.

## 2021-05-24 NOTE — Assessment & Plan Note (Signed)
Will check creat, LFTs on medication.

## 2021-05-24 NOTE — Assessment & Plan Note (Signed)
Hepatitis B immune negative - vaccine started today

## 2021-05-24 NOTE — Assessment & Plan Note (Signed)
Screened negative with swabs

## 2021-08-08 ENCOUNTER — Ambulatory Visit: Payer: Medicaid Other | Admitting: Internal Medicine

## 2021-08-23 ENCOUNTER — Ambulatory Visit: Payer: Medicaid Other | Admitting: Internal Medicine

## 2021-08-24 ENCOUNTER — Ambulatory Visit: Payer: Medicaid Other | Admitting: Internal Medicine

## 2021-08-30 ENCOUNTER — Ambulatory Visit: Payer: Medicaid Other | Admitting: Internal Medicine

## 2021-08-31 ENCOUNTER — Encounter: Payer: Self-pay | Admitting: Internal Medicine

## 2021-08-31 ENCOUNTER — Telehealth: Payer: Self-pay | Admitting: Pharmacist

## 2021-08-31 ENCOUNTER — Ambulatory Visit (INDEPENDENT_AMBULATORY_CARE_PROVIDER_SITE_OTHER): Payer: Self-pay | Admitting: Internal Medicine

## 2021-08-31 ENCOUNTER — Other Ambulatory Visit: Payer: Self-pay

## 2021-08-31 ENCOUNTER — Other Ambulatory Visit (HOSPITAL_COMMUNITY): Payer: Self-pay

## 2021-08-31 ENCOUNTER — Other Ambulatory Visit (HOSPITAL_COMMUNITY)
Admission: RE | Admit: 2021-08-31 | Discharge: 2021-08-31 | Disposition: A | Discharge status: Home / Self Care | Payer: Medicaid Other | Source: Ambulatory Visit | Attending: Internal Medicine | Admitting: Internal Medicine

## 2021-08-31 VITALS — BP 157/65 | HR 64 | Temp 97.7°F | Resp 16 | Ht 69.0 in | Wt 198.8 lb

## 2021-08-31 DIAGNOSIS — B20 Human immunodeficiency virus [HIV] disease: Secondary | ICD-10-CM | POA: Diagnosis present

## 2021-08-31 DIAGNOSIS — Z23 Encounter for immunization: Secondary | ICD-10-CM

## 2021-08-31 DIAGNOSIS — Z113 Encounter for screening for infections with a predominantly sexual mode of transmission: Secondary | ICD-10-CM

## 2021-08-31 NOTE — Progress Notes (Signed)
? ?  Subjective:  ? ? Patient ID: Joshua Leon., male    DOB: November 10, 1995, 26 y.o.   MRN: [id number] ? ?HPI ?Here for follow up of HIV ?He continues on [place] and denies any missed doses.  Last CD4 617, viral load not detected.  No new issues.  Interested in [place].   ? ? ?Review of Systems  ?Constitutional:  Negative for unexpected weight change.  ?Gastrointestinal:  Negative for diarrhea.  ? ?   ?Objective:  ? Physical Exam ?Eyes:  ?   General: No scleral icterus. ?Pulmonary:  ?   Effort: Pulmonary effort is normal.  ?Skin: ?   Findings: No rash.  ?Neurological:  ?   Mental Status: He is alert.  ?Psychiatric:     ?   Mood and Affect: Mood normal.  ? ?SH: no tobacco ? ? ? ?   ?Assessment & Plan:  ? ? ?

## 2021-08-31 NOTE — Telephone Encounter (Signed)
Counseled that [place] is two separate intramuscular injections in the gluteal muscle on each side for each visit. Explained that the second injection is 30 days after the initial injection then every 2 months thereafter.Explained that showing up to injection appointments is very important and warned that if 2 appointments are missed, it will be reassessed by their provider whether they are a good candidate for injection therapy. Counseled on possible side effects associated with the injections such as injection site pain, which is usually mild to moderate in nature, injection site nodules, and injection site reactions. Asked to call the clinic or send me a mychart message if they experience any issues, such as fatigue, nausea, headache, rash, or dizziness. Advised that they can take ibuprofen or tylenol for injection site pain if needed.  ? ?We will await most recent viral load from today's visit given patient is a relatively new diagnosis.  ? ?Lupita Leash, can you look into Cabenuva coverage for this patient? ? ?Margarite Gouge, PharmD, CPP ?Clinical Pharmacist Practitioner ?Infectious Diseases Clinical Pharmacist ?[facility]  ?

## 2021-08-31 NOTE — Telephone Encounter (Signed)
Looks like he just applied for UMAP and waiting on approval... did you want to wait until he get approved or did you want me to apply for [employer] patient assistance.Joshua Leon

## 2021-08-31 NOTE — Assessment & Plan Note (Signed)
Will screen today 

## 2021-08-31 NOTE — Telephone Encounter (Signed)
I see that he was approved for UMAP already now; you can go ahead and apply for patient assistance for him. Thanks!

## 2021-08-31 NOTE — Assessment & Plan Note (Signed)
Discussed vaccines and will give hepatitis B #2 and Menveo ##2 ?

## 2021-08-31 NOTE — Assessment & Plan Note (Signed)
He is doing well, no concerns and will continue Biktarvy for now and convert to [place].  Pharmacy has discussed the process with him.  Follow up with them for this and he can see me in about 6 months.  ?

## 2021-09-01 LAB — T-HELPER CELL (CD4) - (RCID CLINIC ONLY)
CD4 % Helper T Cell: 31 % — ABNORMAL LOW (ref 33–65)
CD4 T Cell Abs: 581 /uL (ref 400–1790)

## 2021-09-01 LAB — CYTOLOGY, (ORAL, ANAL, URETHRAL) ANCILLARY ONLY
Chlamydia: NEGATIVE
Chlamydia: NEGATIVE
Comment: NEGATIVE
Comment: NEGATIVE
Comment: NORMAL
Comment: NORMAL
Neisseria Gonorrhea: NEGATIVE
Neisseria Gonorrhea: POSITIVE — AB

## 2021-09-02 LAB — HIV-1 RNA QUANT-NO REFLEX-BLD
HIV 1 RNA Quant: <20 copies/mL — AB
HIV-1 RNA Quant, Log: <1.3 Log copies/mL — AB

## 2021-09-02 LAB — RPR: RPR Ser Ql: NONREACTIVE

## 2021-09-04 ENCOUNTER — Telehealth: Payer: Self-pay

## 2021-09-04 NOTE — Telephone Encounter (Signed)
Called patient on mobile and home numbers - no answer or voicemail to leave a message.  ? ? ?Joshua Leon, CMA ? ?

## 2021-09-04 NOTE — Telephone Encounter (Signed)
-----   Message from Thayer Headings, MD sent at 09/01/2021  5:51 PM EDT ----- ?He needs treatment for gonorrhea, 500 mg IM ceftriaxone x 1 ?thanks ? ?

## 2021-09-05 NOTE — Telephone Encounter (Signed)
Second attempt - called patient on home and mobile number - no answer or voicemail box to to leave a message.  ? ? ?Joshua Leon P Joshua Leon, CMA ? ? ?

## 2021-09-06 NOTE — Telephone Encounter (Signed)
Third attempt to reach patient. Called both home and mobile number, no answer and unable to leave message on either line.  ? ?Sandie Ano, RN ? ?

## 2021-09-07 NOTE — Telephone Encounter (Signed)
Called patient on home and mobile number listed in chart - also contacted patient emergency contact (mother) [name] - no answer or voicemail to leave a message.  ? ? ?Joshua Leon P Breezy Hertenstein, CMA ? ?

## 2021-09-08 NOTE — Telephone Encounter (Signed)
Staff contact pharmacy for any addition contact phone number listed for patient. Pharmacy had [phone] phone number on file for patient. Staff contacted phone, no answer and no secure voicemail. Will attempt to contact patient again.  ?Valarie Cones ? ?

## 2021-09-11 NOTE — Telephone Encounter (Signed)
Attempted to contact the patient today. Unable to get in contact with the patient. Patient has been referred to [facility]. ? ?Joshua Leon Joshua Leon ? ?

## 2021-09-12 ENCOUNTER — Telehealth: Payer: Self-pay

## 2021-09-12 NOTE — Telephone Encounter (Signed)
Ok you welcome !!

## 2021-09-12 NOTE — Telephone Encounter (Signed)
[facility] Patient Advocate Encounter ? ?Completed and sent [employer] application for [place] for this patient who is uninsured.   ? ?Patient is approved 09/12/21 through 09/12/21. ? ?Medication will be shipped to the clinic once I get an appointment date set up. ? ? ?[name], CPhT ?Specialty Pharmacy Patient Advocate ?[facility] ?Phone: [phone] ?Fax:  [phone]  ?

## 2021-09-12 NOTE — Telephone Encounter (Signed)
Patient scheduled for first injection on 5/8 with me. Thanks Lupita Leash!

## 2021-09-14 ENCOUNTER — Telehealth: Payer: Self-pay

## 2021-09-14 NOTE — Telephone Encounter (Signed)
[facility] Patient Advocate Encounter ? ?Patient's medication (cabenuva) have been couriered to [facility] from [employer] and will be administered on the patient next office visit on 10/02/21. ? ?[name] , CPhT ?Specialty Pharmacy Patient Advocate ?[facility] ?Phone: [phone] ?Fax:  [phone]  ?

## 2021-10-02 ENCOUNTER — Encounter: Payer: Medicaid Other | Admitting: Pharmacist

## 2021-10-11 ENCOUNTER — Encounter: Payer: Medicaid Other | Admitting: Pharmacist

## 2021-10-18 ENCOUNTER — Other Ambulatory Visit (HOSPITAL_COMMUNITY): Payer: Self-pay

## 2021-10-18 ENCOUNTER — Other Ambulatory Visit: Payer: Self-pay

## 2021-10-18 ENCOUNTER — Ambulatory Visit (INDEPENDENT_AMBULATORY_CARE_PROVIDER_SITE_OTHER): Payer: Medicaid Other | Admitting: Pharmacist

## 2021-10-18 ENCOUNTER — Other Ambulatory Visit (HOSPITAL_COMMUNITY)
Admission: RE | Admit: 2021-10-18 | Discharge: 2021-10-18 | Disposition: A | Discharge status: Home / Self Care | Payer: Medicaid Other | Source: Ambulatory Visit | Attending: Internal Medicine | Admitting: Internal Medicine

## 2021-10-18 ENCOUNTER — Encounter: Payer: Self-pay | Admitting: Pharmacist

## 2021-10-18 DIAGNOSIS — B20 Human immunodeficiency virus [HIV] disease: Secondary | ICD-10-CM

## 2021-10-18 DIAGNOSIS — Z113 Encounter for screening for infections with a predominantly sexual mode of transmission: Secondary | ICD-10-CM | POA: Insufficient documentation

## 2021-10-18 MED ORDER — CABOTEGRAVIR & RILPIVIRINE ER 600 & 900 MG/3ML IM SUER
1.0000 | Freq: Once | INTRAMUSCULAR | Status: AC
  Administered 2021-10-18: 1 via INTRAMUSCULAR

## 2021-10-18 MED ORDER — CABOTEGRAVIR & RILPIVIRINE ER 600 & 900 MG/3ML IM SUER: 1.0000 | INTRAMUSCULAR | 1 refills | Status: DC

## 2021-10-18 NOTE — Progress Notes (Unsigned)
HPI: Joshua Leon. is a 26 y.o. male who presents to the [facility] for [place] administration.  Patient Active Problem List   Diagnosis Date Noted   Medication monitoring encounter 05/24/2021   Need for prophylactic vaccination and inoculation against viral hepatitis 05/24/2021   Human immunodeficiency virus (HIV) disease ([place]) 04/28/2021   Screening examination for venereal disease 04/28/2021    Patient's Medications  New Prescriptions   CABOTEGRAVIR & RILPIVIRINE ER (CABENUVA) 600 & 900 MG/3ML INJECTION    Inject 1 kit into the muscle every 2 (two) months.  Previous Medications   No medications on file  Modified Medications   No medications on file  Discontinued Medications   BICTEGRAVIR-EMTRICITABINE-TENOFOVIR AF (BIKTARVY) 50-200-25 MG TABS TABLET    Take 1 tablet by mouth daily.    Allergies: No Known Allergies  Past Medical History: Past Medical History:  Diagnosis Date   Pneumonia     Social History: Social History   Socioeconomic History   Marital status: Single    Spouse name: Not on file   Number of children: Not on file   Years of education: Not on file   Highest education level: Not on file  Occupational History   Not on file  Tobacco Use   Smoking status: Some Days    Types: Cigars   Smokeless tobacco: Never  Vaping Use   Vaping Use: Never used  Substance and Sexual Activity   Alcohol use: Yes    Comment: ocassionally   Drug use: Yes    Types: Marijuana   Sexual activity: Not Currently    Partners: Female, Male    Comment: patient given condoms  Other Topics Concern   Not on file  Social History Narrative   Not on file   Social Determinants of Health   Financial Resource Strain: Not on file  Food Insecurity: Not on file  Transportation Needs: Not on file  Physical Activity: Not on file  Stress: Not on file  Social Connections: Not on file    Labs: Lab Results  Component Value Date   HIV1RNAQUANT <20 DETECTED  (A) 08/31/2021   HIV1RNAQUANT Not Detected 05/23/2021   HIV1RNAQUANT 2,360 (H) 04/28/2021   CD4TABS 581 08/31/2021    RPR and STI Lab Results  Component Value Date   LABRPR NON-REACTIVE 08/31/2021   LABRPR NON-REACTIVE 04/28/2021    STI Results GC CT  08/31/2021  9:07 AM Negative     Positive   Negative     Negative    04/28/2021 11:01 AM Negative     Negative   Negative     Negative      Hepatitis B Lab Results  Component Value Date   HEPBSAB NON-REACTIVE 04/28/2021   HEPBSAG NON-REACTIVE 04/28/2021   HEPBCAB NON-REACTIVE 04/28/2021   Hepatitis C Lab Results  Component Value Date   HEPCAB NON-REACTIVE 04/28/2021   Hepatitis A Lab Results  Component Value Date   HAV REACTIVE (A) 04/28/2021   Lipids: Lab Results  Component Value Date   CHOL 207 (H) 04/28/2021   TRIG 130 04/28/2021   HDL 62 04/28/2021   CHOLHDL 3.3 04/28/2021   LDLCALC 120 (H) 04/28/2021    Current HIV Regimen: Biktarvy > Cabenuva  TARGET DATE: 24th of the month  Assessment: Joshua Leon presents today for their first initiation injection for Cabenuva. Counseled that [place] is two separate intramuscular injections in the gluteal muscle on each side for each visit. Explained that the second injection is 30 days  after the initial injection then every 2 months thereafter. Discussed the need for viral load monitoring every 2 months for the first 6 months and then periodically afterwards as their provider sees the need. Discussed the rare but significant chance of developing resistance despite compliance. Explained that showing up to injection appointments is very important and warned that if 2 appointments are missed, it will be reassessed by their provider whether they are a good candidate for injection therapy. Counseled on possible side effects associated with the injections such as injection site pain, which is usually mild to moderate in nature, injection site nodules, and injection site reactions.  Asked to call the clinic or send me a mychart message if they experience any issues, such as fatigue, nausea, headache, rash, or dizziness. Advised that they can take ibuprofen or tylenol for injection site pain if needed.   Administered cabotegravir 657m/3mL in left upper outer quadrant of the gluteal muscle. Administered rilpivirine 900 mg/372min the right upper outer quadrant of the gluteal muscle. Monitored patient for 10 minutes after injection. Injections were tolerated well without issue. Counseled to stop taking Biktarvy after today's dose and to call with any issues that may arise. Will make follow up appointments for second initiation injection in 30 days and then maintenance injections every 2 months thereafter.   Joshua Leon very excited about the injections today! They also expressed an interest in HRT and following up with the endocrine clinic and/or Dr. CoLinus Salmonsbout possible treatment.   Plan: - Stop Biktarvy after today's dose - First Cabenuva injections administered - Second initiation injection scheduled for 6/21 with Dr. WoRogers Blocker Maintenance injections scheduled for 8/23 with Dr. CoLinus Salmons Call with any issues or questions - F/u STI testing (oral, rectal, and urine) for additional treatment if needed - F/u Hep B Ab on next visit to assess immunity status   Joshua Leon PGY1 Pharmacy Resident [facility]

## 2021-10-19 LAB — CYTOLOGY, (ORAL, ANAL, URETHRAL) ANCILLARY ONLY
Chlamydia: NEGATIVE
Comment: NEGATIVE
Comment: NORMAL
Neisseria Gonorrhea: NEGATIVE

## 2021-10-20 ENCOUNTER — Telehealth: Payer: Self-pay

## 2021-10-20 NOTE — Telephone Encounter (Signed)
Spoke with Annabelle Harman about positive RPR results. They have been testing as non-reactive within the last two months and every time prior. 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 We will continue to follow up RPR to assess treatment success. They are currently out of town for the holiday and an appointment was made for Tuesday, 5/30.  March Rummage, PharmD PGY1 Pharmacy Resident  Please check AMION for all [facility] pharmacy phone numbers After 10:00 PM call main pharmacy [phone]

## 2021-10-23 LAB — HIV-1 RNA QUANT-NO REFLEX-BLD
HIV 1 RNA Quant: 138 copies/mL — ABNORMAL HIGH
HIV-1 RNA Quant, Log: 2.14 Log copies/mL — ABNORMAL HIGH

## 2021-10-23 LAB — RPR TITER: RPR Titer: 1:16 {titer} — ABNORMAL HIGH

## 2021-10-23 LAB — RPR: RPR Ser Ql: REACTIVE — AB

## 2021-10-23 LAB — FLUORESCENT TREPONEMAL AB(FTA)-IGG-BLD: Fluorescent Treponemal ABS: REACTIVE — AB

## 2021-10-24 ENCOUNTER — Ambulatory Visit (INDEPENDENT_AMBULATORY_CARE_PROVIDER_SITE_OTHER): Payer: Medicaid Other | Admitting: Pharmacist

## 2021-10-24 ENCOUNTER — Other Ambulatory Visit: Payer: Self-pay

## 2021-10-24 ENCOUNTER — Ambulatory Visit: Payer: Self-pay | Admitting: Pharmacist

## 2021-10-24 DIAGNOSIS — A539 Syphilis, unspecified: Secondary | ICD-10-CM

## 2021-10-24 LAB — CYTOLOGY, (ORAL, ANAL, URETHRAL) ANCILLARY ONLY
Comment: NEGATIVE
Comment: NORMAL

## 2021-10-24 MED ORDER — PENICILLIN G BENZATHINE 1200000 UNIT/2ML IM SUSY
2.4000 10*6.[IU] | PREFILLED_SYRINGE | Freq: Once | INTRAMUSCULAR | Status: AC
  Administered 2021-10-24: 2.4 10*6.[IU] via INTRAMUSCULAR

## 2021-10-24 NOTE — Progress Notes (Signed)
HPI: Joshua Leon. is a 26 y.o. male who presents to the [facility] clinic for syphilis treatment.  Patient Active Problem List   Diagnosis Date Noted   Medication monitoring encounter 05/24/2021   Need for prophylactic vaccination and inoculation against viral hepatitis 05/24/2021   Human immunodeficiency virus (HIV) disease ([place]) 04/28/2021   Screening examination for venereal disease 04/28/2021    Patient's Medications  New Prescriptions   No medications on file  Previous Medications   CABOTEGRAVIR & RILPIVIRINE ER (CABENUVA) 600 & 900 MG/3ML INJECTION    Inject 1 kit into the muscle every 2 (two) months.  Modified Medications   No medications on file  Discontinued Medications   No medications on file    Allergies: No Known Allergies  Past Medical History: Past Medical History:  Diagnosis Date   Pneumonia     Social History: Social History   Socioeconomic History   Marital status: Single    Spouse name: Not on file   Number of children: Not on file   Years of education: Not on file   Highest education level: Not on file  Occupational History   Not on file  Tobacco Use   Smoking status: Some Days    Types: Cigars   Smokeless tobacco: Never  Vaping Use   Vaping Use: Never used  Substance and Sexual Activity   Alcohol use: Yes    Comment: ocassionally   Drug use: Yes    Types: Marijuana   Sexual activity: Not Currently    Partners: Female, Male    Comment: patient given condoms  Other Topics Concern   Not on file  Social History Narrative   Not on file   Social Determinants of Health   Financial Resource Strain: Not on file  Food Insecurity: Not on file  Transportation Needs: Not on file  Physical Activity: Not on file  Stress: Not on file  Social Connections: Not on file    Labs: Lab Results  Component Value Date   HIV1RNAQUANT 138 (H) 10/18/2021   HIV1RNAQUANT <20 DETECTED (A) 08/31/2021   HIV1RNAQUANT Not Detected 05/23/2021    CD4TABS 581 08/31/2021    RPR and STI Lab Results  Component Value Date   LABRPR REACTIVE (A) 10/18/2021   LABRPR NON-REACTIVE 08/31/2021   LABRPR NON-REACTIVE 04/28/2021   RPRTITER 1:16 (H) 10/18/2021    STI Results GC CT  10/18/2021  3:30 PM Other     Negative   Other     Negative    08/31/2021  9:07 AM Negative     Positive   Negative     Negative    04/28/2021 11:01 AM Negative     Negative   Negative     Negative      Hepatitis B Lab Results  Component Value Date   HEPBSAB NON-REACTIVE 04/28/2021   HEPBSAG NON-REACTIVE 04/28/2021   HEPBCAB NON-REACTIVE 04/28/2021   Hepatitis C Lab Results  Component Value Date   HEPCAB NON-REACTIVE 04/28/2021   Hepatitis A Lab Results  Component Value Date   HAV REACTIVE (A) 04/28/2021   Lipids: Lab Results  Component Value Date   CHOL 207 (H) 04/28/2021   TRIG 130 04/28/2021   HDL 62 04/28/2021   CHOLHDL 3.3 04/28/2021   LDLCALC 120 (H) 04/28/2021    Assessment: Joshua Leon presents today for syphilis treatment. Patient tested positive on 10/18/21 with a titer of 1:16. Last documented RPR was non-reactive on 08/31/21. Patient will need Bicillin 2.4 million  units x IM. No known allergies to any antibiotics.   Administered Bicillin LA 1.2 million units in each upper outer quadrant of the left and right gluteal muscle, totaling a dose of 2.4 million units. Patient tolerated well. Advised to abstain from sexual activity for 10 days and to ask partners to get tested and treated. Advised that the health department should be reaching out to contact trace. Answered all questions and provided condoms. Patient will call with any issues.  Plan: - Administered Bicillin LA 2.4 million units for syphilis diagnosis - Call with any issues or questions - Next cabenuva appointment on 6/21 with Dr. Rogers Blocker, follow up RPR and Hep B Ab on next visit  Varney Daily, PharmD PGY1 Pharmacy Resident [facility] for Infectious Disease

## 2021-10-25 ENCOUNTER — Telehealth: Payer: Self-pay

## 2021-10-25 NOTE — Telephone Encounter (Signed)
Called patient regarding rectal swab collected on 5/24. Per [facility] lab specimen was insufficient and will need to be recollected.  Spoke with Marchelle Folks, Pharmacist who is okay with patient recollecting swab. Is scheduled to come in tomorrow to see pharmacy team for recollection. Juanita Laster, RMA

## 2021-10-26 ENCOUNTER — Ambulatory Visit: Payer: Self-pay | Admitting: Pharmacist

## 2021-10-26 ENCOUNTER — Other Ambulatory Visit: Payer: Self-pay

## 2021-10-27 ENCOUNTER — Ambulatory Visit: Payer: Self-pay | Admitting: Pharmacist

## 2021-11-15 ENCOUNTER — Encounter: Payer: Self-pay | Admitting: Pharmacist

## 2021-11-16 ENCOUNTER — Ambulatory Visit (INDEPENDENT_AMBULATORY_CARE_PROVIDER_SITE_OTHER): Payer: Self-pay | Admitting: Pharmacist

## 2021-11-16 ENCOUNTER — Encounter: Payer: Self-pay | Admitting: Pharmacist

## 2021-11-16 ENCOUNTER — Other Ambulatory Visit: Payer: Self-pay

## 2021-11-16 DIAGNOSIS — B20 Human immunodeficiency virus [HIV] disease: Secondary | ICD-10-CM

## 2021-11-16 DIAGNOSIS — Z113 Encounter for screening for infections with a predominantly sexual mode of transmission: Secondary | ICD-10-CM

## 2021-11-16 MED ORDER — CABOTEGRAVIR & RILPIVIRINE ER 600 & 900 MG/3ML IM SUER
1.0000 | Freq: Once | INTRAMUSCULAR | Status: AC
  Administered 2021-11-16: 1 via INTRAMUSCULAR

## 2021-11-17 LAB — CYTOLOGY, (ORAL, ANAL, URETHRAL) ANCILLARY ONLY
Chlamydia: NEGATIVE
Chlamydia: NEGATIVE
Comment: NEGATIVE
Comment: NEGATIVE
Comment: NORMAL
Comment: NORMAL
Neisseria Gonorrhea: NEGATIVE
Neisseria Gonorrhea: NEGATIVE

## 2021-11-17 LAB — URINE CYTOLOGY ANCILLARY ONLY
Chlamydia: NEGATIVE
Comment: NEGATIVE
Comment: NORMAL
Neisseria Gonorrhea: NEGATIVE

## 2021-11-21 LAB — HIV-1 RNA QUANT-NO REFLEX-BLD
HIV 1 RNA Quant: NOT DETECTED Copies/mL
HIV-1 RNA Quant, Log: NOT DETECTED Log cps/mL

## 2021-11-29 ENCOUNTER — Encounter: Payer: Self-pay | Admitting: Internal Medicine

## 2021-12-12 ENCOUNTER — Ambulatory Visit: Payer: Self-pay

## 2022-01-01 ENCOUNTER — Encounter: Payer: Self-pay | Admitting: Emergency Medicine

## 2022-01-01 ENCOUNTER — Encounter (HOSPITAL_BASED_OUTPATIENT_CLINIC_OR_DEPARTMENT_OTHER): Payer: Self-pay | Admitting: Obstetrics and Gynecology

## 2022-01-01 ENCOUNTER — Ambulatory Visit
Admission: EM | Admit: 2022-01-01 | Discharge: 2022-01-01 | Disposition: A | Discharge status: Home / Self Care | Payer: Medicaid Other

## 2022-01-01 ENCOUNTER — Emergency Department (HOSPITAL_BASED_OUTPATIENT_CLINIC_OR_DEPARTMENT_OTHER): Payer: Self-pay

## 2022-01-01 ENCOUNTER — Emergency Department (HOSPITAL_BASED_OUTPATIENT_CLINIC_OR_DEPARTMENT_OTHER): Payer: Self-pay | Admitting: Radiology

## 2022-01-01 ENCOUNTER — Inpatient Hospital Stay (HOSPITAL_BASED_OUTPATIENT_CLINIC_OR_DEPARTMENT_OTHER)
Admission: EM | Admit: 2022-01-01 | Discharge: 2022-01-03 | DRG: 368 | Disposition: A | Discharge status: Home / Self Care | Payer: Self-pay | Attending: Internal Medicine | Admitting: Internal Medicine

## 2022-01-01 ENCOUNTER — Encounter (HOSPITAL_COMMUNITY): Payer: Self-pay

## 2022-01-01 ENCOUNTER — Other Ambulatory Visit: Payer: Self-pay

## 2022-01-01 DIAGNOSIS — K920 Hematemesis: Secondary | ICD-10-CM

## 2022-01-01 DIAGNOSIS — K226 Gastro-esophageal laceration-hemorrhage syndrome: Principal | ICD-10-CM | POA: Diagnosis present

## 2022-01-01 DIAGNOSIS — Z79899 Other long term (current) drug therapy: Secondary | ICD-10-CM

## 2022-01-01 DIAGNOSIS — R197 Diarrhea, unspecified: Secondary | ICD-10-CM

## 2022-01-01 DIAGNOSIS — U071 COVID-19: Secondary | ICD-10-CM

## 2022-01-01 DIAGNOSIS — B20 Human immunodeficiency virus [HIV] disease: Secondary | ICD-10-CM | POA: Diagnosis present

## 2022-01-01 DIAGNOSIS — F17290 Nicotine dependence, other tobacco product, uncomplicated: Secondary | ICD-10-CM | POA: Diagnosis present

## 2022-01-01 DIAGNOSIS — K254 Chronic or unspecified gastric ulcer with hemorrhage: Secondary | ICD-10-CM | POA: Diagnosis present

## 2022-01-01 DIAGNOSIS — K449 Diaphragmatic hernia without obstruction or gangrene: Secondary | ICD-10-CM | POA: Diagnosis present

## 2022-01-01 DIAGNOSIS — K922 Gastrointestinal hemorrhage, unspecified: Secondary | ICD-10-CM | POA: Diagnosis present

## 2022-01-01 HISTORY — DX: COVID-19: U07.1

## 2022-01-01 LAB — CBC WITH DIFFERENTIAL/PLATELET
Abs Immature Granulocytes: 0.01 10*3/uL (ref 0.00–0.07)
Basophils Absolute: 0 10*3/uL (ref 0.0–0.1)
Basophils Relative: 0 %
Eosinophils Absolute: 0 10*3/uL (ref 0.0–0.5)
Eosinophils Relative: 0 %
HCT: 35 % — ABNORMAL LOW (ref 39.0–52.0)
Hemoglobin: 12.1 g/dL — ABNORMAL LOW (ref 13.0–17.0)
Immature Granulocytes: 0 %
Lymphocytes Relative: 36 %
Lymphs Abs: 1.7 10*3/uL (ref 0.7–4.0)
MCH: 29.7 pg (ref 26.0–34.0)
MCHC: 34.6 g/dL (ref 30.0–36.0)
MCV: 85.8 fL (ref 80.0–100.0)
Monocytes Absolute: 0.3 10*3/uL (ref 0.1–1.0)
Monocytes Relative: 5 %
Neutro Abs: 2.7 10*3/uL (ref 1.7–7.7)
Neutrophils Relative %: 59 %
Platelets: 165 10*3/uL (ref 150–400)
RBC: 4.08 MIL/uL — ABNORMAL LOW (ref 4.22–5.81)
RDW: 12.8 % (ref 11.5–15.5)
WBC: 4.7 10*3/uL (ref 4.0–10.5)
nRBC: 0 % (ref 0.0–0.2)

## 2022-01-01 LAB — LACTIC ACID, PLASMA
Lactic Acid, Venous: 0.8 mmol/L (ref 0.5–1.9)
Lactic Acid, Venous: 1 mmol/L (ref 0.5–1.9)

## 2022-01-01 LAB — COMPREHENSIVE METABOLIC PANEL
ALT: 16 U/L (ref 0–44)
AST: 19 U/L (ref 15–41)
Albumin: 4.6 g/dL (ref 3.5–5.0)
Alkaline Phosphatase: 40 U/L (ref 38–126)
Anion gap: 9 (ref 5–15)
BUN: 31 mg/dL — ABNORMAL HIGH (ref 6–20)
CO2: 27 mmol/L (ref 22–32)
Calcium: 8.9 mg/dL (ref 8.9–10.3)
Chloride: 104 mmol/L (ref 98–111)
Creatinine, Ser: 0.97 mg/dL (ref 0.61–1.24)
GFR, Estimated: >60 mL/min (ref >60)
Glucose, Bld: 104 mg/dL — ABNORMAL HIGH (ref 70–99)
Potassium: 3.8 mmol/L (ref 3.5–5.1)
Sodium: 140 mmol/L (ref 135–145)
Total Bilirubin: 0.4 mg/dL (ref 0.3–1.2)
Total Protein: 7.7 g/dL (ref 6.5–8.1)

## 2022-01-01 LAB — SARS CORONAVIRUS 2 BY RT PCR: SARS Coronavirus 2 by RT PCR: POSITIVE — AB

## 2022-01-01 LAB — LIPASE, BLOOD: Lipase: 15 U/L (ref 11–51)

## 2022-01-01 MED ORDER — SODIUM CHLORIDE 0.9% FLUSH
3.0000 mL | Freq: Two times a day (BID) | INTRAVENOUS | Status: DC
  Administered 2022-01-02: 3 mL via INTRAVENOUS

## 2022-01-01 MED ORDER — NIRMATRELVIR/RITONAVIR (PAXLOVID)TABLET
3.0000 | ORAL_TABLET | Freq: Two times a day (BID) | ORAL | Status: DC
  Administered 2022-01-01 – 2022-01-02 (×2): 3 via ORAL
  Filled 2022-01-01: qty 30

## 2022-01-01 MED ORDER — SODIUM CHLORIDE 0.9 % IV SOLN
INTRAVENOUS | Status: DC
Start: 2022-01-01 — End: 2022-01-03

## 2022-01-01 MED ORDER — ONDANSETRON HCL 4 MG/2ML IJ SOLN
4.0000 mg | Freq: Four times a day (QID) | INTRAMUSCULAR | Status: DC | PRN
  Administered 2022-01-01: 4 mg via INTRAVENOUS
  Filled 2022-01-01 (×2): qty 2

## 2022-01-01 MED ORDER — PANTOPRAZOLE SODIUM 40 MG IV SOLR
40.0000 mg | Freq: Two times a day (BID) | INTRAVENOUS | Status: DC
  Administered 2022-01-01 – 2022-01-02 (×2): 40 mg via INTRAVENOUS
  Filled 2022-01-01 (×3): qty 10

## 2022-01-01 MED ORDER — ONDANSETRON HCL 4 MG/2ML IJ SOLN
4.0000 mg | Freq: Once | INTRAMUSCULAR | Status: AC
  Administered 2022-01-01: 4 mg via INTRAVENOUS
  Filled 2022-01-01: qty 2

## 2022-01-01 MED ORDER — ACETAMINOPHEN 650 MG RE SUPP: 650.0000 mg | Freq: Four times a day (QID) | RECTAL | Status: DC | PRN

## 2022-01-01 MED ORDER — ONDANSETRON HCL 4 MG PO TABS: 4.0000 mg | ORAL_TABLET | Freq: Four times a day (QID) | ORAL | Status: DC | PRN

## 2022-01-01 MED ORDER — ACETAMINOPHEN 325 MG PO TABS
650.0000 mg | ORAL_TABLET | Freq: Four times a day (QID) | ORAL | Status: DC | PRN
  Filled 2022-01-01: qty 2

## 2022-01-01 MED ORDER — LACTATED RINGERS IV BOLUS
1000.0000 mL | Freq: Once | INTRAVENOUS | Status: AC
  Administered 2022-01-01: 1000 mL via INTRAVENOUS

## 2022-01-01 MED ORDER — PANTOPRAZOLE SODIUM 40 MG IV SOLR
40.0000 mg | Freq: Once | INTRAVENOUS | Status: AC
  Administered 2022-01-01: 40 mg via INTRAVENOUS
  Filled 2022-01-01: qty 10

## 2022-01-01 MED ORDER — KETOROLAC TROMETHAMINE 15 MG/ML IJ SOLN: 15.0000 mg | Freq: Once | INTRAMUSCULAR | Status: DC

## 2022-01-01 NOTE — ED Triage Notes (Signed)
Pt is present today with abdominal pain, dizzy, diarrhea, and vomiting. Pt sx started this morning. Pt states the vomiting started Friday

## 2022-01-01 NOTE — Assessment & Plan Note (Signed)
-   Positive on screening on admission; GI symptoms may be associated.  Does not have any respiratory symptoms.  CXR clear and on room air -Patient amenable with starting on treatment - Start Paxlovid

## 2022-01-01 NOTE — ED Notes (Signed)
Patient is being discharged from the Urgent Care and sent to the Emergency Department via POV . Per Ervin Knack NP, patient is in need of higher level of care due to blood in vomit . Patient is aware and verbalizes understanding of plan of care.  Vitals:   01/01/22 0824  BP: 107/71  Pulse: (!) 101  Resp: 18  Temp: 98 F (36.7 C)  SpO2: 98%

## 2022-01-01 NOTE — Assessment & Plan Note (Signed)
-   on q47month Cabenuva; next dose 01/17/22 per patient - follows with Dr. Luciana Axe - last VL 138 (previously undetectable); last CD4 581 on 08/31/21 - repeat CD4 and VL now

## 2022-01-01 NOTE — Progress Notes (Signed)
Plan of Care Note for accepted transfer   Patient: Joshua Leon. MRN: [id number]   DOA: 01/01/2022  Facility requesting transfer: [facility] Requesting Provider: Dr. Doran Durand Reason for transfer: Hematemesis Facility course: 26 yo M w. History of HIV. Presenting with epigastric abdominal pain and coffee ground emesis. Hematemesis noted in ED. Hgb 12.4 (baseline Hgb is 16). EDP spoke with Dr. Loreta Ave. Requesting admission to [facility]. Started on protonix.   Plan of care: The patient is accepted for admission to Telemetry unit, at [facility].  While holding at [facility], medical decision making responsibilities remain with the EDP. Upon arrival to [facility], [facility] will assume care. Thank you.   Author: Teddy Spike, DO 01/01/2022  Check [email or web address] for on-call coverage.  Nursing staff, Please call [facility] Admits & Consults System-Wide number on Amion as soon as patient's arrival, so appropriate admitting provider can evaluate the pt.

## 2022-01-01 NOTE — H&P (View-Only) (Signed)
UNASSIGNED PATIENT Reason for Consult: Blood in vomitus with drop in hemoglobin. Referring Physician: THP.  Joshua Edwin Koch Jr. is an 25 y.o. male.  HPI: Joshua Leon is a 25-year-old male with a history of HIV infection who presented to the [facility] for further evaluation nausea with hematemesis that started this morning while she was driving to work. She also had some blood in her stool last night and this morning. In the emergency room she was noted to have a hemoglobin 12.1 g/dL previous hemoglobin being at 16 g/dL. She is also found to be COVID-positive. She denies use of any nonsteroidals. On Friday 12/29/21 she was given an Aspirin and Theraflu by her doctor at work for a cough. She has had a cough all weekend. She admits to using marijuana occasionally. She is on Cabenuva for HIV which she takes every 2 months her next dose is scheduled to be administered on 01/17/2022 and she follows with Dr. Comer in infectious disease.  Her last viral load was 138 last CD4 count was 581 in April of this year.  Past Medical History:  Diagnosis Date   Pneumonia    History reviewed. No pertinent surgical history.  Family History  Problem Relation Age of Onset   Graves' disease Mother    Cancer Neg Hx    Diabetes Neg Hx    Heart failure Neg Hx    Hyperlipidemia Neg Hx    Social History:  reports that she has been smoking cigars. She has never used smokeless tobacco. She reports current alcohol use. She reports current drug use. Drug: Marijuana.  Allergies: No Known Allergies  Medications: I have reviewed the patient's current medications. Prior to Admission:  Medications Prior to Admission  Medication Sig Dispense Refill Last Dose   cabotegravir & rilpivirine ER (CABENUVA) 600 & 900 MG/3ML injection Inject 1 kit into the muscle every 2 (two) months. 6 mL 1 11/17/2021   Scheduled:  nirmatrelvir/ritonavir EUA  3 tablet Oral BID   pantoprazole (PROTONIX) IV  40 mg Intravenous Q12H   sodium  chloride flush  3 mL Intravenous Q12H   Continuous:  sodium chloride 75 mL/hr at 01/01/22 1614   Results for orders placed or performed during the hospital encounter of 01/01/22 (from the past 48 hour(s))  Lactic acid, plasma     Status: None   Collection Time: 01/01/22 10:10 AM  Result Value Ref Range   Lactic Acid, Venous 0.8 0.5 - 1.9 mmol/L    Comment: Performed at [facility], [address] [facility], [place], [place] [address]  Comprehensive metabolic panel     Status: Abnormal   Collection Time: 01/01/22 10:10 AM  Result Value Ref Range   Sodium 140 135 - 145 mmol/L   Potassium 3.8 3.5 - 5.1 mmol/L   Chloride 104 98 - 111 mmol/L   CO2 27 22 - 32 mmol/L   Glucose, Bld 104 (H) 70 - 99 mg/dL    Comment: Glucose reference range applies only to samples taken after fasting for at least 8 hours.   BUN 31 (H) 6 - 20 mg/dL   Creatinine, Ser 0.97 0.61 - 1.24 mg/dL   Calcium 8.9 8.9 - 10.3 mg/dL   Total Protein 7.7 6.5 - 8.1 g/dL   Albumin 4.6 3.5 - 5.0 g/dL   AST 19 15 - 41 U/L   ALT 16 0 - 44 U/L   Alkaline Phosphatase 40 38 - 126 U/L   Total Bilirubin 0.4 0.3 - 1.2 mg/dL     GFR, Estimated >60 >60 mL/min    Comment: (NOTE) Calculated using the CKD-EPI Creatinine Equation (2021)    Anion gap 9 5 - 15    Comment: Performed at [facility], [address], [place], [place] [address]  CBC with Differential     Status: Abnormal   Collection Time: 01/01/22 10:10 AM  Result Value Ref Range   WBC 4.7 4.0 - 10.5 K/uL   RBC 4.08 (L) 4.22 - 5.81 MIL/uL   Hemoglobin 12.1 (L) 13.0 - 17.0 g/dL   HCT 35.0 (L) 39.0 - 52.0 %   MCV 85.8 80.0 - 100.0 fL   MCH 29.7 26.0 - 34.0 pg   MCHC 34.6 30.0 - 36.0 g/dL   RDW 12.8 11.5 - 15.5 %   Platelets 165 150 - 400 K/uL   nRBC 0.0 0.0 - 0.2 %   Neutrophils Relative % 59 %   Neutro Abs 2.7 1.7 - 7.7 K/uL   Lymphocytes Relative 36 %   Lymphs Abs 1.7 0.7 - 4.0 K/uL   Monocytes Relative 5 %   Monocytes Absolute 0.3  0.1 - 1.0 K/uL   Eosinophils Relative 0 %   Eosinophils Absolute 0.0 0.0 - 0.5 K/uL   Basophils Relative 0 %   Basophils Absolute 0.0 0.0 - 0.1 K/uL   Immature Granulocytes 0 %   Abs Immature Granulocytes 0.01 0.00 - 0.07 K/uL    Comment: Performed at [facility], [address], [place], [place] [address]  Lipase, blood     Status: None   Collection Time: 01/01/22 10:10 AM  Result Value Ref Range   Lipase 15 11 - 51 U/L    Comment: Performed at [facility], [address], [place], [place] [address]  SARS Coronavirus 2 by RT PCR (hospital order, performed in  hospital lab) *cepheid single result test* Anterior Nasal Swab     Status: Abnormal   Collection Time: 01/01/22 12:03 PM   Specimen: Anterior Nasal Swab  Result Value Ref Range   SARS Coronavirus 2 by RT PCR POSITIVE (A) NEGATIVE    Comment: (NOTE) SARS-CoV-2 target nucleic acids are DETECTED  SARS-CoV-2 RNA is generally detectable in upper respiratory specimens  during the acute phase of infection.  Positive results are indicative  of the presence of the identified virus, but do not rule out bacterial infection or co-infection with other pathogens not detected by the test.  Clinical correlation with patient history and  other diagnostic information is necessary to determine patient infection status.  The expected result is negative.  Fact Sheet for Patients:   https://www.fda.gov/media/158405/download   Fact Sheet for Healthcare Providers:   https://www.fda.gov/media/158404/download    This test is not yet approved or cleared by the [place] FDA and  has been authorized for detection and/or diagnosis of SARS-CoV-2 by FDA under an Emergency Use Authorization (EUA).  This EUA will remain in effect (meaning this test can be used) for the duration of  the COVID-19 declaration under Section 564(b)(1)  of the Act, 21 U.S.C. section 360-bbb-3(b)(1), unless the  authorization is terminated or revoked sooner.   Performed at [facility], [address], [place], [place] [address]   Lactic acid, plasma     Status: None   Collection Time: 01/01/22  2:56 PM  Result Value Ref Range   Lactic Acid, Venous 1.0 0.5 - 1.9 mmol/L    Comment: Performed at [facility], [address] [place], [place] [address]      DG Chest 2 View  Result Date: 01/01/2022 CLINICAL DATA:  Cough EXAM: CHEST - 2 VIEW COMPARISON:  Chest x-ray 06/23/2020. FINDINGS: The heart size and mediastinal contours are within normal limits. Both lungs are clear. No visible pleural effusions or pneumothorax. No acute osseous abnormality. IMPRESSION: No active cardiopulmonary disease. Electronically Signed   By: Margaretha Sheffield M.D.   On: 01/01/2022 10:30    Review of Systems  Constitutional: Negative.   HENT: Negative.    Respiratory: Negative.    Cardiovascular: Negative.   Gastrointestinal:  Positive for diarrhea, nausea and vomiting. Negative for abdominal distention, abdominal pain, anal bleeding, blood in stool, constipation and rectal pain.  Genitourinary: Negative.   Musculoskeletal: Negative.   Skin: Negative.   Allergic/Immunologic: Negative.   Neurological: Negative.   Hematological: Negative.   Psychiatric/Behavioral: Negative.     Blood pressure 128/74, pulse 100, temperature 99.5 F (37.5 C), temperature source Oral, resp. rate 16, height 5' 9" (1.753 m), weight 91.4 kg, SpO2 100 %. Physical Exam Constitutional:      General: She is in acute distress.     Appearance: Normal appearance. She is not diaphoretic.  HENT:     Head: Normocephalic and atraumatic.     Mouth/Throat:     Mouth: Mucous membranes are dry.  Eyes:     Extraocular Movements: Extraocular movements intact.     Pupils: Pupils are equal, round, and reactive to light.  Cardiovascular:     Rate and Rhythm: Normal rate and regular rhythm.  Pulmonary:      Effort: Pulmonary effort is normal.     Breath sounds: Normal breath sounds.  Musculoskeletal:        General: Normal range of motion.     Cervical back: Neck supple.  Skin:    General: Skin is warm and dry.  Neurological:     General: No focal deficit present.     Mental Status: She is alert and oriented to person, place, and time. Mental status is at baseline.  Psychiatric:        Mood and Affect: Mood normal.        Behavior: Behavior normal.        Thought Content: Thought content normal.        Judgment: Judgment normal.   Assessment/Plan: 1) Nausea with blood in the emesis/blood in stool-we will plan to do an EGD tomorrow. This could possibly be an ulcer versus Mallory-Weiss tear.  Agree with PPIs for now. 2) COVID-19 infection on Paxlovid. 3) HIV disease.  Juanita Craver 01/01/2022, 5:06 PM

## 2022-01-01 NOTE — Assessment & Plan Note (Signed)
-   patient endorsing "brown" stool but some "red" tinge to vomit this morning; seems to describe some vomiting intermittently since Friday but then 2 episodes this morning which then had some blood-tinge to them -Baseline hemoglobin last year around 16 g/dL and hemoglobin on admission is 12.1 g/dL - differential includes MW tear from retching vs possible ulcer though low risk (no tobacco use, no NSAID use, social etoh use) -Continue twice daily Protonix IV - repeat CBC in am - follow up GI consult

## 2022-01-01 NOTE — Consult Note (Signed)
UNASSIGNED PATIENT Reason for Consult: Blood in vomitus with drop in hemoglobin. Referring Physician: THP.  Joshua Edwin Corral Jr. is an 25 y.o. male.  HPI: Joshua Leon is a 25-year-old male with a history of HIV infection who presented to the [facility] for further evaluation nausea with hematemesis that started this morning while she was driving to work. She also had some blood in her stool last night and this morning. In the emergency room she was noted to have a hemoglobin 12.1 g/dL previous hemoglobin being at 16 g/dL. She is also found to be COVID-positive. She denies use of any nonsteroidals. On Friday 12/29/21 she was given an Aspirin and Theraflu by her doctor at work for a cough. She has had a cough all weekend. She admits to using marijuana occasionally. She is on Cabenuva for HIV which she takes every 2 months her next dose is scheduled to be administered on 01/17/2022 and she follows with Dr. Comer in infectious disease.  Her last viral load was 138 last CD4 count was 581 in April of this year.  Past Medical History:  Diagnosis Date   Pneumonia    History reviewed. No pertinent surgical history.  Family History  Problem Relation Age of Onset   Graves' disease Mother    Cancer Neg Hx    Diabetes Neg Hx    Heart failure Neg Hx    Hyperlipidemia Neg Hx    Social History:  reports that she has been smoking cigars. She has never used smokeless tobacco. She reports current alcohol use. She reports current drug use. Drug: Marijuana.  Allergies: No Known Allergies  Medications: I have reviewed the patient's current medications. Prior to Admission:  Medications Prior to Admission  Medication Sig Dispense Refill Last Dose   cabotegravir & rilpivirine ER (CABENUVA) 600 & 900 MG/3ML injection Inject 1 kit into the muscle every 2 (two) months. 6 mL 1 11/17/2021   Scheduled:  nirmatrelvir/ritonavir EUA  3 tablet Oral BID   pantoprazole (PROTONIX) IV  40 mg Intravenous Q12H   sodium  chloride flush  3 mL Intravenous Q12H   Continuous:  sodium chloride 75 mL/hr at 01/01/22 1614   Results for orders placed or performed during the hospital encounter of 01/01/22 (from the past 48 hour(s))  Lactic acid, plasma     Status: None   Collection Time: 01/01/22 10:10 AM  Result Value Ref Range   Lactic Acid, Venous 0.8 0.5 - 1.9 mmol/L    Comment: Performed at [facility], [address] [facility] [address], [place], [place] [address]  Comprehensive metabolic panel     Status: Abnormal   Collection Time: 01/01/22 10:10 AM  Result Value Ref Range   Sodium 140 135 - 145 mmol/L   Potassium 3.8 3.5 - 5.1 mmol/L   Chloride 104 98 - 111 mmol/L   CO2 27 22 - 32 mmol/L   Glucose, Bld 104 (H) 70 - 99 mg/dL    Comment: Glucose reference range applies only to samples taken after fasting for at least 8 hours.   BUN 31 (H) 6 - 20 mg/dL   Creatinine, Ser 0.97 0.61 - 1.24 mg/dL   Calcium 8.9 8.9 - 10.3 mg/dL   Total Protein 7.7 6.5 - 8.1 g/dL   Albumin 4.6 3.5 - 5.0 g/dL   AST 19 15 - 41 U/L   ALT 16 0 - 44 U/L   Alkaline Phosphatase 40 38 - 126 U/L   Total Bilirubin 0.4 0.3 - 1.2 mg/dL     GFR, Estimated >60 >60 mL/min    Comment: (NOTE) Calculated using the CKD-EPI Creatinine Equation (2021)    Anion gap 9 5 - 15    Comment: Performed at [facility], [address], [place], [place] [address]  CBC with Differential     Status: Abnormal   Collection Time: 01/01/22 10:10 AM  Result Value Ref Range   WBC 4.7 4.0 - 10.5 K/uL   RBC 4.08 (L) 4.22 - 5.81 MIL/uL   Hemoglobin 12.1 (L) 13.0 - 17.0 g/dL   HCT 35.0 (L) 39.0 - 52.0 %   MCV 85.8 80.0 - 100.0 fL   MCH 29.7 26.0 - 34.0 pg   MCHC 34.6 30.0 - 36.0 g/dL   RDW 12.8 11.5 - 15.5 %   Platelets 165 150 - 400 K/uL   nRBC 0.0 0.0 - 0.2 %   Neutrophils Relative % 59 %   Neutro Abs 2.7 1.7 - 7.7 K/uL   Lymphocytes Relative 36 %   Lymphs Abs 1.7 0.7 - 4.0 K/uL   Monocytes Relative 5 %   Monocytes Absolute 0.3  0.1 - 1.0 K/uL   Eosinophils Relative 0 %   Eosinophils Absolute 0.0 0.0 - 0.5 K/uL   Basophils Relative 0 %   Basophils Absolute 0.0 0.0 - 0.1 K/uL   Immature Granulocytes 0 %   Abs Immature Granulocytes 0.01 0.00 - 0.07 K/uL    Comment: Performed at [facility], [address], [place], [place] [address]  Lipase, blood     Status: None   Collection Time: 01/01/22 10:10 AM  Result Value Ref Range   Lipase 15 11 - 51 U/L    Comment: Performed at [facility], [address], [place], [place] [address]  SARS Coronavirus 2 by RT PCR (hospital order, performed in [facility] hospital lab) *cepheid single result test* Anterior Nasal Swab     Status: Abnormal   Collection Time: 01/01/22 12:03 PM   Specimen: Anterior Nasal Swab  Result Value Ref Range   SARS Coronavirus 2 by RT PCR POSITIVE (A) NEGATIVE    Comment: (NOTE) SARS-CoV-2 target nucleic acids are DETECTED  SARS-CoV-2 RNA is generally detectable in upper respiratory specimens  during the acute phase of infection.  Positive results are indicative  of the presence of the identified virus, but do not rule out bacterial infection or co-infection with other pathogens not detected by the test.  Clinical correlation with patient history and  other diagnostic information is necessary to determine patient infection status.  The expected result is negative.  Fact Sheet for Patients:   https://www.fda.gov/media/158405/download   Fact Sheet for Healthcare Providers:   https://www.fda.gov/media/158404/download    This test is not yet approved or cleared by the [place] FDA and  has been authorized for detection and/or diagnosis of SARS-CoV-2 by FDA under an Emergency Use Authorization (EUA).  This EUA will remain in effect (meaning this test can be used) for the duration of  the COVID-19 declaration under Section 564(b)(1)  of the Act, 21 U.S.C. section 360-bbb-3(b)(1), unless the  authorization is terminated or revoked sooner.   Performed at [facility], [address], [place], [place] [address]   Lactic acid, plasma     Status: None   Collection Time: 01/01/22  2:56 PM  Result Value Ref Range   Lactic Acid, Venous 1.0 0.5 - 1.9 mmol/L    Comment: Performed at  [facility], [address] [place], [place] [address]      DG Chest 2 View  Result Date: 01/01/2022 CLINICAL DATA:  Cough EXAM: CHEST - 2 VIEW COMPARISON:  Chest x-ray 06/23/2020. FINDINGS: The heart size and mediastinal contours are within normal limits. Both lungs are clear. No visible pleural effusions or pneumothorax. No acute osseous abnormality. IMPRESSION: No active cardiopulmonary disease. Electronically Signed   By: Margaretha Sheffield M.D.   On: 01/01/2022 10:30    Review of Systems  Constitutional: Negative.   HENT: Negative.    Respiratory: Negative.    Cardiovascular: Negative.   Gastrointestinal:  Positive for diarrhea, nausea and vomiting. Negative for abdominal distention, abdominal pain, anal bleeding, blood in stool, constipation and rectal pain.  Genitourinary: Negative.   Musculoskeletal: Negative.   Skin: Negative.   Allergic/Immunologic: Negative.   Neurological: Negative.   Hematological: Negative.   Psychiatric/Behavioral: Negative.     Blood pressure 128/74, pulse 100, temperature 99.5 F (37.5 C), temperature source Oral, resp. rate 16, height 5' 9" (1.753 m), weight 91.4 kg, SpO2 100 %. Physical Exam Constitutional:      General: She is in acute distress.     Appearance: Normal appearance. She is not diaphoretic.  HENT:     Head: Normocephalic and atraumatic.     Mouth/Throat:     Mouth: Mucous membranes are dry.  Eyes:     Extraocular Movements: Extraocular movements intact.     Pupils: Pupils are equal, round, and reactive to light.  Cardiovascular:     Rate and Rhythm: Normal rate and regular rhythm.  Pulmonary:      Effort: Pulmonary effort is normal.     Breath sounds: Normal breath sounds.  Musculoskeletal:        General: Normal range of motion.     Cervical back: Neck supple.  Skin:    General: Skin is warm and dry.  Neurological:     General: No focal deficit present.     Mental Status: She is alert and oriented to person, place, and time. Mental status is at baseline.  Psychiatric:        Mood and Affect: Mood normal.        Behavior: Behavior normal.        Thought Content: Thought content normal.        Judgment: Judgment normal.   Assessment/Plan: 1) Nausea with blood in the emesis/blood in stool-we will plan to do an EGD tomorrow. This could possibly be an ulcer versus Mallory-Weiss tear.  Agree with PPIs for now. 2) COVID-19 infection on Paxlovid. 3) HIV disease.  Juanita Craver 01/01/2022, 5:06 PM

## 2022-01-01 NOTE — ED Provider Notes (Signed)
[facility]    CSN: [id number] Arrival date & time: 01/01/22  0865      History   Chief Complaint Chief Complaint  Patient presents with   Abdominal Pain   Emesis    HPI Joshua Leon. is a 26 y.o. male.   Patient presents with nausea, vomiting, diarrhea that started about 4 to 5 days ago.  Patient reports that nausea and vomiting started about 4 days ago and diarrhea started this morning upon awakening.  Patient has also felt very dizzy at times.  Denies any associated abdominal pain.  Patient reports that they started having large "blood clots" in emesis today with bright red blood.  They have not been able to keep any food or fluids down since symptoms started.  Patient reports that stool is "darker brown in color" but denies any obvious blood in stool.  Denies any associated fever, upper respiratory symptoms, cough.  Patient reports that there has been a stomach virus outbreak at workplace.  They were seen by healthcare provider at work twice.  Patient reports that they think that they were given aspirin for symptoms but no other medications by health at work.  They were sent home today by their work.   Abdominal Pain Emesis   Past Medical History:  Diagnosis Date   Pneumonia     Patient Active Problem List   Diagnosis Date Noted   Medication monitoring encounter 05/24/2021   Need for prophylactic vaccination and inoculation against viral hepatitis 05/24/2021   Human immunodeficiency virus (HIV) disease ([place]) 04/28/2021   Screening examination for venereal disease 04/28/2021    History reviewed. No pertinent surgical history.     Home Medications    Prior to Admission medications   Medication Sig Start Date End Date Taking? Authorizing Provider  cabotegravir & rilpivirine ER (CABENUVA) 600 & 900 MG/3ML injection Inject 1 kit into the muscle every 2 (two) months. 10/18/21   Joshua Leon, RPH-CPP    Family History Family History  Problem  Relation Age of Onset   Graves' disease Mother    Cancer Neg Hx    Diabetes Neg Hx    Heart failure Neg Hx    Hyperlipidemia Neg Hx     Social History Social History   Tobacco Use   Smoking status: Some Days    Types: Cigars   Smokeless tobacco: Never  Vaping Use   Vaping Use: Never used  Substance Use Topics   Alcohol use: Yes    Comment: ocassionally   Drug use: Yes    Types: Marijuana     Allergies   Patient has no known allergies.   Review of Systems Review of Systems Per HPI  Physical Exam Triage Vital Signs ED Triage Vitals  Enc Vitals Group     BP 01/01/22 0824 107/71     Pulse Rate 01/01/22 0824 (!) 101     Resp 01/01/22 0824 18     Temp 01/01/22 0824 98 F (36.7 C)     Temp src --      SpO2 01/01/22 0824 98 %     Weight --      Height --      Head Circumference --      Peak Flow --      Pain Score 01/01/22 0823 0     Pain Loc --      Pain Edu? --      Excl. in [place]? --    No data  found.  Updated Vital Signs BP 107/71   Pulse (!) 101   Temp 98 F (36.7 C)   Resp 18   SpO2 98%   Visual Acuity Right Eye Distance:   Left Eye Distance:   Bilateral Distance:    Right Eye Near:   Left Eye Near:    Bilateral Near:     Physical Exam Constitutional:      General: He is not in acute distress.    Appearance: Normal appearance. He is not toxic-appearing or diaphoretic.  HENT:     Head: Normocephalic and atraumatic.     Mouth/Throat:     Mouth: Mucous membranes are moist.     Pharynx: No posterior oropharyngeal erythema.  Eyes:     Extraocular Movements: Extraocular movements intact.     Conjunctiva/sclera: Conjunctivae normal.  Cardiovascular:     Rate and Rhythm: Normal rate and regular rhythm.     Pulses: Normal pulses.     Heart sounds: Normal heart sounds.  Pulmonary:     Effort: Pulmonary effort is normal. No respiratory distress.     Breath sounds: Normal breath sounds.  Abdominal:     General: Bowel sounds are normal. There  is no distension.     Palpations: Abdomen is soft.     Tenderness: There is no abdominal tenderness.  Skin:    General: Skin is warm and dry.  Neurological:     General: No focal deficit present.     Mental Status: He is alert and oriented to person, place, and time. Mental status is at baseline.  Psychiatric:        Mood and Affect: Mood normal.        Behavior: Behavior normal.        Thought Content: Thought content normal.        Judgment: Judgment normal.      UC Treatments / Results  Labs (all labs ordered are listed, but only abnormal results are displayed) Labs Reviewed - No data to display  EKG   Radiology No results found.  Procedures Procedures (including critical care time)  Medications Ordered in UC Medications - No data to display  Initial Impression / Assessment and Plan / UC Course  I have reviewed the triage vital signs and the nursing notes.  Pertinent labs & imaging results that were available during my care of the patient were reviewed by me and considered in my medical decision making (see chart for details).     Symptoms are most likely viral in etiology.  Although, patient is reporting "large blood clots" in emesis which is concerning.  I do think patient needs stat blood work which cannot be provided here in urgent care.  Advised patient to go to the ER for further evaluation and management of hemataemesis as well as possible need for IV fluids.  Patient was agreeable with plan.  Vital signs stable at discharge.  Agree with patient self transport to the hospital. Final Clinical Impressions(s) / UC Diagnoses   Final diagnoses:  Hematemesis with nausea  Diarrhea, unspecified type     Discharge Instructions      Please go to the emergency department as soon as you leave urgent care for further evaluation and management.    ED Prescriptions   None    PDMP not reviewed this encounter.   Joshua Leon, [place] 01/01/22 [phone]

## 2022-01-01 NOTE — ED Triage Notes (Signed)
Patient reports to the ER for emesis, blood in stool, diarrhea, and dizziness. Patient reports they got off work Friday and was told that there was a stomach bug going around and they are concerned it may be more. Patient reports they have continued to have all symptoms since Friday with more blood in emesis and stool since then.

## 2022-01-01 NOTE — Plan of Care (Signed)

## 2022-01-01 NOTE — H&P (Signed)
History and Physical    Joshua Leon.  ESP:[id number]  DOB: [birth year]  DOA: 01/01/2022  PCP: Patient, No Pcp Per Patient coming from: home  Chief Complaint: dark stool, red vomitus  HPI:  Joshua Leon is a 26 yo with PMH HIV who presented to [facility] with complaints of dark stools, nausea, vomiting.  Patient states that GI symptoms started around Friday when people at work also had similar symptoms.  She states that this morning on the way to work she developed nausea followed by 2 episodes of vomiting which had a mixture of red tint to it. She then presented to [facility] for further evaluation. Vitals were stable and unremarkable. Hemoglobin noted to be 12.1 g/dL with prior values noted around 16 g/dL. Remainder of labs were unremarkable. COVID testing was performed and found to be positive.  CXR was clear with no acute abnormalities.  After discussion, she was amenable with starting on treatment on admission as well. GI was also consulted on admission for further evaluation.   Patient denies any tobacco use.  Does use marijuana, last use around Friday.  Endorses social drinking.  Denies any other illicit drug use.  Denies OTC pain meds notably any NSAIDs.  I have personally briefly reviewed patient's old medical records in [facility] and discussed patient with the ER provider when appropriate/indicated.  Assessment and Plan: * GIB (gastrointestinal bleeding) - patient endorsing "brown" stool but some "red" tinge to vomit this morning; seems to describe some vomiting intermittently since Friday but then 2 episodes this morning which then had some blood-tinge to them -Baseline hemoglobin last year around 16 g/dL and hemoglobin on admission is 12.1 g/dL - differential includes MW tear from retching vs possible ulcer though low risk (no tobacco use, no NSAID use, social etoh use) -Continue twice daily Protonix IV - repeat CBC in am - follow up GI consult   COVID-19 virus infection -  Positive on screening on admission; GI symptoms may be associated.  Does not have any respiratory symptoms.  CXR clear and on room air -Patient amenable with starting on treatment - Start Paxlovid  Human immunodeficiency virus (HIV) disease ([place]) - on q17monthCabenuva; next dose 01/17/22 per patient - follows with Dr. CLinus Salmons- last VL 138 (previously undetectable); last CD4 581 on 08/31/21 - repeat CD4 and VL now   Code Status:    Code Status: Full Code  DVT Prophylaxis:   SCDs Start: 01/01/22 1543   Anticipated disposition is to: Home  History: Past Medical History:  Diagnosis Date   Pneumonia     History reviewed. No pertinent surgical history.   reports that she has been smoking cigars. She has never used smokeless tobacco. She reports current alcohol use. She reports current drug use. Drug: Marijuana.  No Known Allergies  Family History  Problem Relation Age of Onset   Graves' disease Mother    Cancer Neg Hx    Diabetes Neg Hx    Heart failure Neg Hx    Hyperlipidemia Neg Hx    Home Medications: Prior to Admission medications   Medication Sig Start Date End Date Taking? Authorizing Provider  cabotegravir & rilpivirine ER (CABENUVA) 600 & 900 MG/3ML injection Inject 1 kit into the muscle every 2 (two) months. 10/18/21  Yes WEsmond Plants RPH-CPP    Review of Systems:  Review of Systems  Constitutional: Negative.   HENT: Negative.    Eyes: Negative.   Respiratory: Negative.    Cardiovascular: Negative.  Gastrointestinal:  Positive for melena, nausea and vomiting.  Genitourinary: Negative.   Musculoskeletal: Negative.   Skin: Negative.   Neurological: Negative.   Endo/Heme/Allergies: Negative.   Psychiatric/Behavioral: Negative.      Physical Exam:  Vitals:   01/01/22 1006 01/01/22 1445 01/01/22 1503  BP: 135/87 128/74   Pulse: 100    Resp: 14 16   Temp: 99 F (37.2 C) 99.5 F (37.5 C)   TempSrc:  Oral   SpO2: 100% 100%   Weight:   91.4 kg  Height:    5' 9" (1.753 m)   Physical Exam Constitutional:      General: She is not in acute distress.    Appearance: Normal appearance.  HENT:     Head: Normocephalic and atraumatic.     Mouth/Throat:     Mouth: Mucous membranes are moist.  Eyes:     Extraocular Movements: Extraocular movements intact.  Cardiovascular:     Rate and Rhythm: Normal rate and regular rhythm.     Heart sounds: Normal heart sounds.  Pulmonary:     Effort: Pulmonary effort is normal. No respiratory distress.     Breath sounds: Normal breath sounds. No wheezing.  Abdominal:     General: Bowel sounds are normal. There is no distension.     Palpations: Abdomen is soft.     Tenderness: There is no abdominal tenderness.  Musculoskeletal:        General: Normal range of motion.     Cervical back: Normal range of motion and neck supple.  Skin:    General: Skin is warm and dry.  Neurological:     General: No focal deficit present.     Mental Status: She is alert.  Psychiatric:        Mood and Affect: Mood normal.        Behavior: Behavior normal.      Labs on Admission:  I have personally reviewed following labs and imaging studies Results for orders placed or performed during the hospital encounter of 01/01/22 (from the past 24 hour(s))  Lactic acid, plasma     Status: None   Collection Time: 01/01/22 10:10 AM  Result Value Ref Range   Lactic Acid, Venous 0.8 0.5 - 1.9 mmol/L  Comprehensive metabolic panel     Status: Abnormal   Collection Time: 01/01/22 10:10 AM  Result Value Ref Range   Sodium 140 135 - 145 mmol/L   Potassium 3.8 3.5 - 5.1 mmol/L   Chloride 104 98 - 111 mmol/L   CO2 27 22 - 32 mmol/L   Glucose, Bld 104 (H) 70 - 99 mg/dL   BUN 31 (H) 6 - 20 mg/dL   Creatinine, Ser 0.97 0.61 - 1.24 mg/dL   Calcium 8.9 8.9 - 10.3 mg/dL   Total Protein 7.7 6.5 - 8.1 g/dL   Albumin 4.6 3.5 - 5.0 g/dL   AST 19 15 - 41 U/L   ALT 16 0 - 44 U/L   Alkaline Phosphatase 40 38 - 126 U/L   Total Bilirubin  0.4 0.3 - 1.2 mg/dL   GFR, Estimated >60 >60 mL/min   Anion gap 9 5 - 15  CBC with Differential     Status: Abnormal   Collection Time: 01/01/22 10:10 AM  Result Value Ref Range   WBC 4.7 4.0 - 10.5 K/uL   RBC 4.08 (L) 4.22 - 5.81 MIL/uL   Hemoglobin 12.1 (L) 13.0 - 17.0 g/dL   HCT 35.0 (L) 39.0 - 52.0 %  MCV 85.8 80.0 - 100.0 fL   MCH 29.7 26.0 - 34.0 pg   MCHC 34.6 30.0 - 36.0 g/dL   RDW 12.8 11.5 - 15.5 %   Platelets 165 150 - 400 K/uL   nRBC 0.0 0.0 - 0.2 %   Neutrophils Relative % 59 %   Neutro Abs 2.7 1.7 - 7.7 K/uL   Lymphocytes Relative 36 %   Lymphs Abs 1.7 0.7 - 4.0 K/uL   Monocytes Relative 5 %   Monocytes Absolute 0.3 0.1 - 1.0 K/uL   Eosinophils Relative 0 %   Eosinophils Absolute 0.0 0.0 - 0.5 K/uL   Basophils Relative 0 %   Basophils Absolute 0.0 0.0 - 0.1 K/uL   Immature Granulocytes 0 %   Abs Immature Granulocytes 0.01 0.00 - 0.07 K/uL  Lipase, blood     Status: None   Collection Time: 01/01/22 10:10 AM  Result Value Ref Range   Lipase 15 11 - 51 U/L  SARS Coronavirus 2 by RT PCR (hospital order, performed in [facility] hospital lab) *cepheid single result test* Anterior Nasal Swab     Status: Abnormal   Collection Time: 01/01/22 12:03 PM   Specimen: Anterior Nasal Swab  Result Value Ref Range   SARS Coronavirus 2 by RT PCR POSITIVE (A) NEGATIVE  Lactic acid, plasma     Status: None   Collection Time: 01/01/22  2:56 PM  Result Value Ref Range   Lactic Acid, Venous 1.0 0.5 - 1.9 mmol/L     Radiological Exams on Admission: DG Chest 2 View  Result Date: 01/01/2022 CLINICAL DATA:  Cough EXAM: CHEST - 2 VIEW COMPARISON:  Chest x-ray 06/23/2020. FINDINGS: The heart size and mediastinal contours are within normal limits. Both lungs are clear. No visible pleural effusions or pneumothorax. No acute osseous abnormality. IMPRESSION: No active cardiopulmonary disease. Electronically Signed   By: Margaretha Sheffield M.D.   On: 01/01/2022 10:30   DG Chest 2 View   Final Result      Consults called:  GI   Dwyane Dee, MD [facility] 01/01/2022, 3:59 PM

## 2022-01-01 NOTE — Discharge Instructions (Signed)

## 2022-01-01 NOTE — Progress Notes (Signed)
Patient arrived to 1518 in NAD, VS stable and patient free from pain. Patient oriented to room and call bell in reach.

## 2022-01-01 NOTE — ED Provider Notes (Signed)
[facility] EMERGENCY DEPT Provider Note   CSN: [id number] Arrival date & time: 01/01/22  0957     History Chief Complaint  Patient presents with   Cough    HPI Joshua Leon. is a 26 y.o. male presenting for cough, fatigue, nausea vomiting diarrhea throughout the past 4 days.  Patient states that multiple people at work been sick for patient states his symptoms have been grossly worsening over the past few days.  Patient otherwise ambulatory with poor p.o. tolerance for the past 24 hours.  Endorses recurrent vomiting with bright red blood component in his vomiting this morning. Patient otherwise healthy, takes no medications on a normal day. Patient's recorded medical, surgical, social, medication list and allergies were reviewed in the Snapshot window as part of the initial history.   Review of Systems   Review of Systems  Constitutional:  Negative for chills and fever.  HENT:  Negative for ear pain and sore throat.   Eyes:  Negative for pain and visual disturbance.  Respiratory:  Positive for cough. Negative for shortness of breath.   Cardiovascular:  Negative for chest pain and palpitations.  Gastrointestinal:  Positive for diarrhea, nausea and vomiting. Negative for abdominal pain.  Genitourinary:  Negative for dysuria and hematuria.  Musculoskeletal:  Negative for arthralgias and back pain.  Skin:  Negative for color change and rash.  Neurological:  Negative for seizures and syncope.  All other systems reviewed and are negative.   Physical Exam Updated Vital Signs BP 135/87 (BP Location: Right Arm)   Pulse 100   Temp 99 F (37.2 C)   Resp 14   SpO2 100%  Physical Exam Vitals and nursing note reviewed.  Constitutional:      Appearance: He is well-developed.  HENT:     Head: Normocephalic and atraumatic.     Nose: No congestion or rhinorrhea.     Mouth/Throat:     Mouth: Mucous membranes are moist.     Pharynx: Oropharynx is clear. No  oropharyngeal exudate.  Eyes:     Conjunctiva/sclera: Conjunctivae normal.     Pupils: Pupils are equal, round, and reactive to light.  Cardiovascular:     Rate and Rhythm: Normal rate and regular rhythm.     Heart sounds: No murmur heard. Pulmonary:     Effort: Pulmonary effort is normal. No respiratory distress.     Breath sounds: Normal breath sounds.  Abdominal:     Palpations: Abdomen is soft.     Tenderness: There is no abdominal tenderness. There is no right CVA tenderness, left CVA tenderness or guarding.  Musculoskeletal:        General: No swelling, tenderness, deformity or signs of injury. Normal range of motion.     Cervical back: Neck supple. No rigidity or tenderness.  Skin:    General: Skin is warm and dry.  Neurological:     General: No focal deficit present.     Mental Status: He is alert and oriented to person, place, and time. Mental status is at baseline.     Cranial Nerves: No cranial nerve deficit.     Motor: No weakness.      ED Course/ Medical Decision Making/ A&P Clinical Course as of 01/01/22 1332  Mon Jan 01, 2022  1209 Dr. Loreta Ave, MD [CC]    Clinical Course User Index [CC] Glyn Ade, MD    Procedures Procedures   Medications Ordered in ED Medications  lactated ringers bolus 1,000 mL (1,000 mLs Intravenous  New Bag/Given 01/01/22 1235)  ondansetron (ZOFRAN) injection 4 mg (4 mg Intravenous Given 01/01/22 1233)  pantoprazole (PROTONIX) injection 40 mg (40 mg Intravenous Given 01/01/22 1313)    Medical Decision Making:   Joshua Leon. is a 26 y.o. male who presented to the ED today coffee ground emesis, sore throat, fatigue, detailed above.     Additional history discussed with patient's family/caregivers.  Patient's presentation is complicated by their history of HIV compliant with therapy.  Patient placed on continuous vitals and telemetry monitoring while in ED which was reviewed periodically.   Complete initial physical exam  performed, notably the patient was HDS in NAD.      Reviewed and confirmed nursing documentation for past medical history, family history, social history.    Initial Assessment:   Patient's history of present on cefazolin findings are most concerning for developing upper GI bleed.  Favor likely ulcerative in nature given reported coffee-ground emesis and gross melena.  This may be complicated by patient's developing viral upper respiratory symptoms which may have prompted the recent worsening of his emesis with bright red blood today.  Patient does endorse worsening fatigue as well as intermittent gastroesophageal reflux episodes over the past few months as well concerning for possible chronic component. This is most consistent with an acute life/limb threatening illness complicated by underlying chronic conditions.  Initial Plan:  Screening labs including CBC and Metabolic panel to evaluate for infectious or metabolic etiology of disease.  Urinalysis with reflex culture ordered to evaluate for UTI or relevant urologic/nephrologic pathology.  CXR to evaluate for structural/infectious intrathoracic pathology.  Objective evaluation as below reviewed with plan for close reassessment  Initial Study Results:   Laboratory  Notable for four-point drop in patient's hemoglobin over the past interval.  Radiology  All images reviewed independently. Agree with radiology report at this time.   DG Chest 2 View  Result Date: 01/01/2022 CLINICAL DATA:  Cough EXAM: CHEST - 2 VIEW COMPARISON:  Chest x-ray 06/23/2020. FINDINGS: The heart size and mediastinal contours are within normal limits. Both lungs are clear. No visible pleural effusions or pneumothorax. No acute osseous abnormality. IMPRESSION: No active cardiopulmonary disease. Electronically Signed   By: Feliberto Harts M.D.   On: 01/01/2022 10:30    Final Assessment and Plan:   Discussed ongoing care and management with gastroenterology Dr. Loreta Ave and  hospitalist Dr. Ronaldo Miyamoto.  [facility].  Patient arranged for transfer at this time with no further care and management.  Patient treated with Protonix, no indication for transfusion at this time. Patient incidentally noted to be COVID-positive likely etiology of the upper respiratory infection symptoms.    Clinical Impression:  1. UGIB (upper gastrointestinal bleed)         Admit   Final Clinical Impression(s) / ED Diagnoses Final diagnoses:  UGIB (upper gastrointestinal bleed)    Rx / DC Orders ED Discharge Orders     None         Glyn Ade, MD 01/01/22 1332

## 2022-01-01 NOTE — Discharge Instructions (Signed)
Please go to the emergency department as soon as you leave urgent care for further evaluation and management. ?

## 2022-01-01 NOTE — Hospital Course (Addendum)
Joshua Leon is a 26 yo with PMH HIV who presented to [facility] with complaints of dark stools, nausea, vomiting.  Patient states that GI symptoms started around Friday when people at work also had similar symptoms.  She states that this morning on the way to work she developed nausea followed by 2 episodes of vomiting which had a mixture of red tint to it. She then presented to [facility] for further evaluation. Vitals were stable and unremarkable. Hemoglobin noted to be 12.1 g/dL with prior values noted around 16 g/dL. Remainder of labs were unremarkable. COVID testing was performed and found to be positive.  CXR was clear with no acute abnormalities.  After discussion, she was amenable with starting on treatment on admission as well. GI was also consulted on admission for further evaluation.   Patient denies any tobacco use.  Does use marijuana, last use around Friday.  Endorses social drinking.  Denies any other illicit drug use.  Denies OTC pain meds notably any NSAIDs.

## 2022-01-02 ENCOUNTER — Observation Stay (HOSPITAL_COMMUNITY): Payer: Self-pay | Admitting: Certified Registered Nurse Anesthetist

## 2022-01-02 ENCOUNTER — Encounter (HOSPITAL_COMMUNITY): Payer: Self-pay | Admitting: Internal Medicine

## 2022-01-02 ENCOUNTER — Encounter (HOSPITAL_COMMUNITY)
Admission: EM | Disposition: A | Discharge status: Home / Self Care | Payer: Self-pay | Source: Home / Self Care | Attending: Internal Medicine

## 2022-01-02 DIAGNOSIS — B20 Human immunodeficiency virus [HIV] disease: Secondary | ICD-10-CM

## 2022-01-02 DIAGNOSIS — K449 Diaphragmatic hernia without obstruction or gangrene: Secondary | ICD-10-CM

## 2022-01-02 DIAGNOSIS — K226 Gastro-esophageal laceration-hemorrhage syndrome: Secondary | ICD-10-CM

## 2022-01-02 DIAGNOSIS — D649 Anemia, unspecified: Secondary | ICD-10-CM

## 2022-01-02 HISTORY — PX: ESOPHAGOGASTRODUODENOSCOPY (EGD) WITH PROPOFOL: SHX5813

## 2022-01-02 HISTORY — PX: HEMOSTASIS CLIP PLACEMENT: SHX6857

## 2022-01-02 LAB — T-HELPER CELLS (CD4) COUNT (NOT AT ARMC)
CD4 % Helper T Cell: 36 % (ref 33–65)
CD4 T Cell Abs: 817 /uL (ref 400–1790)

## 2022-01-02 LAB — CBC WITH DIFFERENTIAL/PLATELET
Abs Immature Granulocytes: 0.01 10*3/uL (ref 0.00–0.07)
Basophils Absolute: 0 10*3/uL (ref 0.0–0.1)
Basophils Relative: 1 %
Eosinophils Absolute: 0 10*3/uL (ref 0.0–0.5)
Eosinophils Relative: 1 %
HCT: 26.6 % — ABNORMAL LOW (ref 39.0–52.0)
Hemoglobin: 9 g/dL — ABNORMAL LOW (ref 13.0–17.0)
Immature Granulocytes: 0 %
Lymphocytes Relative: 65 %
Lymphs Abs: 2.5 10*3/uL (ref 0.7–4.0)
MCH: 29.7 pg (ref 26.0–34.0)
MCHC: 33.8 g/dL (ref 30.0–36.0)
MCV: 87.8 fL (ref 80.0–100.0)
Monocytes Absolute: 0.3 10*3/uL (ref 0.1–1.0)
Monocytes Relative: 7 %
Neutro Abs: 1 10*3/uL — ABNORMAL LOW (ref 1.7–7.7)
Neutrophils Relative %: 26 %
Platelets: 118 10*3/uL — ABNORMAL LOW (ref 150–400)
RBC: 3.03 MIL/uL — ABNORMAL LOW (ref 4.22–5.81)
RDW: 12.9 % (ref 11.5–15.5)
WBC: 3.9 10*3/uL — ABNORMAL LOW (ref 4.0–10.5)
nRBC: 0 % (ref 0.0–0.2)

## 2022-01-02 LAB — HIV-1 RNA QUANT-NO REFLEX-BLD
HIV 1 RNA Quant: <20 copies/mL
LOG10 HIV-1 RNA: UNDETERMINED log10copy/mL

## 2022-01-02 LAB — BASIC METABOLIC PANEL
Anion gap: 5 (ref 5–15)
BUN: 18 mg/dL (ref 6–20)
CO2: 28 mmol/L (ref 22–32)
Calcium: 8.1 mg/dL — ABNORMAL LOW (ref 8.9–10.3)
Chloride: 106 mmol/L (ref 98–111)
Creatinine, Ser: 0.99 mg/dL (ref 0.61–1.24)
GFR, Estimated: >60 mL/min (ref >60)
Glucose, Bld: 102 mg/dL — ABNORMAL HIGH (ref 70–99)
Potassium: 3.7 mmol/L (ref 3.5–5.1)
Sodium: 139 mmol/L (ref 135–145)

## 2022-01-02 LAB — MAGNESIUM: Magnesium: 1.9 mg/dL (ref 1.7–2.4)

## 2022-01-02 SURGERY — ESOPHAGOGASTRODUODENOSCOPY (EGD) WITH PROPOFOL
Anesthesia: Monitor Anesthesia Care

## 2022-01-02 MED ORDER — PROPOFOL 500 MG/50ML IV EMUL
INTRAVENOUS | Status: DC | PRN
  Administered 2022-01-02 (×3): 30 mg via INTRAVENOUS
  Administered 2022-01-02: 150 ug/kg/min via INTRAVENOUS

## 2022-01-02 MED ORDER — PANTOPRAZOLE SODIUM 40 MG PO TBEC
40.0000 mg | DELAYED_RELEASE_TABLET | Freq: Once | ORAL | Status: AC
Start: 2022-01-02 — End: 2022-01-02
  Administered 2022-01-02: 40 mg via ORAL
  Filled 2022-01-02: qty 1

## 2022-01-02 MED ORDER — SODIUM CHLORIDE 0.9 % IV SOLN: INTRAVENOUS | Status: DC

## 2022-01-02 MED ORDER — LACTATED RINGERS IV SOLN: INTRAVENOUS | Status: DC

## 2022-01-02 SURGICAL SUPPLY — 15 items

## 2022-01-02 NOTE — Anesthesia Preprocedure Evaluation (Signed)
Anesthesia Evaluation  Patient identified by MRN, date of birth, ID band Patient awake    Reviewed: Allergy & Precautions, NPO status , Patient's Chart, lab work & pertinent test results  Airway Mallampati: II  TM Distance: >3 FB Neck ROM: Full    Dental  (+) Dental Advisory Given, Chipped,    Pulmonary Current Smoker and Patient abstained from smoking.,    Pulmonary exam normal breath sounds clear to auscultation       Cardiovascular negative cardio ROS Normal cardiovascular exam Rhythm:Regular Rate:Normal     Neuro/Psych negative neurological ROS  negative psych ROS   GI/Hepatic (+)     substance abuse  marijuana use, Hematemesis    Endo/Other  negative endocrine ROS  Renal/GU negative Renal ROS     Musculoskeletal negative musculoskeletal ROS (+)   Abdominal   Peds  Hematology  (+) Blood dyscrasia, anemia , HIV,   Anesthesia Other Findings Day of surgery medications reviewed with the patient.  Reproductive/Obstetrics                             Anesthesia Physical Anesthesia Plan  ASA: 3  Anesthesia Plan: MAC   Post-op Pain Management: Minimal or no pain anticipated   Induction: Intravenous  PONV Risk Score and Plan: 0 and TIVA and Treatment may vary due to age or medical condition  Airway Management Planned: Natural Airway and Nasal Cannula  Additional Equipment:   Intra-op Plan:   Post-operative Plan:   Informed Consent: I have reviewed the patients History and Physical, chart, labs and discussed the procedure including the risks, benefits and alternatives for the proposed anesthesia with the patient or authorized representative who has indicated his/her understanding and acceptance.     Dental advisory given  Plan Discussed with: CRNA  Anesthesia Plan Comments:         Anesthesia Quick Evaluation

## 2022-01-02 NOTE — Transfer of Care (Signed)
Immediate Anesthesia Transfer of Care Note  Patient: Joshua Leon.  Procedure(s) Performed: ESOPHAGOGASTRODUODENOSCOPY (EGD) WITH PROPOFOL HEMOSTASIS CLIP PLACEMENT  Patient Location: PACU  Anesthesia Type:MAC  Level of Consciousness: sedated, patient cooperative and responds to stimulation  Airway & Oxygen Therapy: Patient Spontanous Breathing and Patient connected to face mask oxygen  Post-op Assessment: Report given to RN and Post -op Vital signs reviewed and stable  Post vital signs: Reviewed and stable  Last Vitals:  Vitals Value Taken Time  BP    Temp    Pulse    Resp    SpO2      Last Pain:  Vitals:   01/02/22 1542  TempSrc: Temporal  PainSc: 0-No pain         Complications: No notable events documented.

## 2022-01-02 NOTE — Progress Notes (Signed)
Progress Note    Joshua Leon.   ZOX:[id number]  DOB: September 11, 1995  DOA: 01/01/2022     0 PCP: Patient, No Pcp Per  Initial CC: vomiting blood tinged   Hospital Course: Joshua Leon is a 26 yo with PMH HIV who presented to [facility] with complaints of dark stools, nausea, vomiting.  Patient states that GI symptoms started around Friday when people at work also had similar symptoms.  She states that this morning on the way to work she developed nausea followed by 2 episodes of vomiting which had a mixture of red tint to it. She then presented to [facility] for further evaluation. Vitals were stable and unremarkable. Hemoglobin noted to be 12.1 g/dL with prior values noted around 16 g/dL. Remainder of labs were unremarkable. COVID testing was performed and found to be positive.  CXR was clear with no acute abnormalities.  After discussion, she was amenable with starting on treatment on admission as well. GI was also consulted on admission for further evaluation.   Patient denies any tobacco use.  Does use marijuana, last use around Friday.  Endorses social drinking.  Denies any other illicit drug use.  Denies OTC pain meds notably any NSAIDs.  Interval History:  Denied any events overnight.  No further vomiting since admission and denies bright red blood but did endorse stools appearing dark this morning.  Asymptomatic and denies any dizziness or lightheadedness.  Assessment and Plan: * GIB (gastrointestinal bleeding) - patient endorsing "brown" stool but some "red" tinge to vomit the morning of admission; seems to describe some vomiting intermittently since Friday but then 2 episodes which then had some blood-tinge to them -Baseline hemoglobin last year around 16 g/dL and hemoglobin on admission is 12.1 g/dL - differential includes MW tear from retching vs possible ulcer though low risk (no tobacco use, no NSAID use, social etoh use) - Hgb further drop to 9 g/dL today -Underwent EGD with GI on  01/02/2022.  10 mm non bleeding Mallory-Weiss tear noted treated with 4 clips. 2 cm hiatal hernia also noted -Continue daily Protonix for 1 month per GI - CLD for today; advance tomorrow and hopeful discharge at that time  COVID-19 virus infection - Positive on screening on admission; GI symptoms may be associated.  Does not have any respiratory symptoms.  CXR clear and on room air -Patient amenable with starting on treatment - Start Paxlovid  Human immunodeficiency virus (HIV) disease (HCC) - on q22month Cabenuva; next dose 01/17/22 per patient - follows with Dr. Luciana Axe - last VL 138 (previously undetectable); last CD4 581 on 08/31/21 - CD4 this admission 817; follow up pending VL   Old records reviewed in assessment of this patient  Antimicrobials: Paxlovid 01/01/2022 >> current  DVT prophylaxis:  SCDs Start: 01/01/22 1543   Code Status:   Code Status: Full Code  Mobility Assessment (last 72 hours)     Mobility Assessment     Row Name 01/01/22 2150 01/01/22 1500         Does patient have an order for bedrest or is patient medically unstable No - Continue assessment No - Continue assessment      What is the highest level of mobility based on the progressive mobility assessment? Level 6 (Walks independently in room and hall) - Balance while walking in room without assist - Complete Level 6 (Walks independently in room and hall) - Balance while walking in room without assist - Complete  Barriers to discharge: None Disposition Plan: Home Wednesday Status is: Inpt  Objective: Blood pressure 126/78, pulse 88, temperature 97.8 F (36.6 C), temperature source Temporal, resp. rate 17, height 5\' 9"  (1.753 m), weight 91.4 kg, SpO2 100 %.  Examination:  Physical Exam Constitutional:      General: She is not in acute distress.    Appearance: Normal appearance.  HENT:     Head: Normocephalic and atraumatic.     Mouth/Throat:     Mouth: Mucous membranes are moist.   Eyes:     Extraocular Movements: Extraocular movements intact.  Cardiovascular:     Rate and Rhythm: Normal rate and regular rhythm.     Heart sounds: Normal heart sounds.  Pulmonary:     Effort: Pulmonary effort is normal. No respiratory distress.     Breath sounds: Normal breath sounds. No wheezing.  Abdominal:     General: Bowel sounds are normal. There is no distension.     Palpations: Abdomen is soft.     Tenderness: There is no abdominal tenderness.  Musculoskeletal:        General: Normal range of motion.     Cervical back: Normal range of motion and neck supple.  Skin:    General: Skin is warm and dry.  Neurological:     General: No focal deficit present.     Mental Status: She is alert.  Psychiatric:        Mood and Affect: Mood normal.        Behavior: Behavior normal.      Consultants:  GI  Procedures:  EGD 01/02/22  Data Reviewed: Results for orders placed or performed during the hospital encounter of 01/01/22 (from the past 24 hour(s))  T-helper cells (CD4) count (not at [facility])     Status: None   Collection Time: 01/01/22  5:35 PM  Result Value Ref Range   CD4 T Cell Abs 817 400 - 1,790 /uL   CD4 % Helper T Cell 36 33 - 65 %  Basic metabolic panel     Status: Abnormal   Collection Time: 01/02/22  5:07 AM  Result Value Ref Range   Sodium 139 135 - 145 mmol/L   Potassium 3.7 3.5 - 5.1 mmol/L   Chloride 106 98 - 111 mmol/L   CO2 28 22 - 32 mmol/L   Glucose, Bld 102 (H) 70 - 99 mg/dL   BUN 18 6 - 20 mg/dL   Creatinine, Ser 03/04/22 0.61 - 1.24 mg/dL   Calcium 8.1 (L) 8.9 - 10.3 mg/dL   GFR, Estimated 7.06 >23 mL/min   Anion gap 5 5 - 15  CBC with Differential/Platelet     Status: Abnormal   Collection Time: 01/02/22  5:07 AM  Result Value Ref Range   WBC 3.9 (L) 4.0 - 10.5 K/uL   RBC 3.03 (L) 4.22 - 5.81 MIL/uL   Hemoglobin 9.0 (L) 13.0 - 17.0 g/dL   HCT 03/04/22 (L) 28.3 - 15.1 %   MCV 87.8 80.0 - 100.0 fL   MCH 29.7 26.0 - 34.0 pg   MCHC 33.8 30.0 - 36.0  g/dL   RDW 76.1 60.7 - 37.1 %   Platelets 118 (L) 150 - 400 K/uL   nRBC 0.0 0.0 - 0.2 %   Neutrophils Relative % 26 %   Neutro Abs 1.0 (L) 1.7 - 7.7 K/uL   Lymphocytes Relative 65 %   Lymphs Abs 2.5 0.7 - 4.0 K/uL   Monocytes Relative 7 %  Monocytes Absolute 0.3 0.1 - 1.0 K/uL   Eosinophils Relative 1 %   Eosinophils Absolute 0.0 0.0 - 0.5 K/uL   Basophils Relative 1 %   Basophils Absolute 0.0 0.0 - 0.1 K/uL   Immature Granulocytes 0 %   Abs Immature Granulocytes 0.01 0.00 - 0.07 K/uL  Magnesium     Status: None   Collection Time: 01/02/22  5:07 AM  Result Value Ref Range   Magnesium 1.9 1.7 - 2.4 mg/dL    I have Reviewed nursing notes, Vitals, and Lab results since pt's last encounter. Pertinent lab results : see above I have ordered test including BMP, CBC, Mg I have reviewed the last note from staff over past 24 hours I have discussed pt's care plan and test results with nursing staff, case manager   LOS: 0 days   Lewie Chamber, MD [facility] 01/02/2022, 4:49 PM

## 2022-01-02 NOTE — Op Note (Signed)
[facility] Patient Name: Joshua Leon Procedure Date: 01/02/2022 MRN: [id number] Attending MD: Jeani Hawking , MD Date of Birth: 07-19-1995 CSN: [id number] Age: 26 Admit Type: Inpatient Procedure:                Upper GI endoscopy Indications:              Hematemesis Providers:                Jeani Hawking, MD, Lorenza Evangelist, RN, Joannie Springs, Technician Referring MD:              Medicines:                Propofol per Anesthesia Complications:            No immediate complications. Estimated Blood Loss:     Estimated blood loss: none. Procedure:                Pre-Anesthesia Assessment:                           - Prior to the procedure, a History and Physical                            was performed, and patient medications and                            allergies were reviewed. The patient's tolerance of                            previous anesthesia was also reviewed. The risks                            and benefits of the procedure and the sedation                            options and risks were discussed with the patient.                            All questions were answered, and informed consent                            was obtained. Prior Anticoagulants: The patient has                            taken no previous anticoagulant or antiplatelet                            agents. ASA Grade Assessment: III - A patient with                            severe systemic disease. After reviewing the risks                            and benefits,  the patient was deemed in                            satisfactory condition to undergo the procedure.                           - Sedation was administered by an anesthesia                            professional. Deep sedation was attained.                           After obtaining informed consent, the endoscope was                            passed under direct vision. Throughout the                             procedure, the patient's blood pressure, pulse, and                            oxygen saturations were monitored continuously. The                            [id number]-[id number] ([id number]) Olympus endoscope was introduced                            through the mouth, and advanced to the second part                            of duodenum. The upper GI endoscopy was                            accomplished without difficulty. The patient                            tolerated the procedure well. Scope In: Scope Out: Findings:      A 2 cm hiatal hernia was present.      A 10 mm non-bleeding Mallory-Weiss tear with stigmata of recent bleeding       was found. For hemostasis, four hemostatic clips were successfully       placed. There was no bleeding at the end of the procedure.      The examined duodenum was normal.      Just below the Z-line was an overlying clot over a presumed       Mallory-Weiss tear. Three was no evidence of any active bleeding.       Because of the size, and the overlying clot, four hemoclips were placed       across the site. No bleeding was induced. Impression:               - 2 cm hiatal hernia.                           - Non-bleeding gastric ulcer with adherent clot.  Clips were placed.                           - Normal examined duodenum.                           - No specimens collected. Moderate Sedation:      Not Applicable - Patient had care per Anesthesia. Recommendation:           - Return patient to hospital ward for ongoing care.                           - Clear liquid diet.                           - Continue present medications.                           - PPI QD x 1 month.                           - Advance diet as tolerated tomorrow. Procedure Code(s):        --- Professional ---                           [id number], Esophagogastroduodenoscopy, flexible,                            transoral; with control of  bleeding, any method Diagnosis Code(s):        --- Professional ---                           K22.6, Gastro-esophageal laceration-hemorrhage                            syndrome                           K44.9, Diaphragmatic hernia without obstruction or                            gangrene                           K92.0, Hematemesis CPT copyright 2019 [employer] Medical Association. All rights reserved. The codes documented in this report are preliminary and upon coder review may  be revised to meet current compliance requirements. Jeani Hawking, MD Jeani Hawking, MD 01/02/2022 4:33:43 PM This report has been signed electronically. Number of Addenda: 0

## 2022-01-02 NOTE — Interval H&P Note (Signed)
History and Physical Interval Note:  01/02/2022 4:05 PM  Joshua Leon.  has presented today for surgery, with the diagnosis of hematemesis, anemia, blood in stool..  The various methods of treatment have been discussed with the patient and family. After consideration of risks, benefits and other options for treatment, the patient has consented to  Procedure(s) with comments: ESOPHAGOGASTRODUODENOSCOPY (EGD) WITH PROPOFOL (N/A) - Hematememesis, anemia, blood in stool.I as a surgical intervention.  The patient's history has been reviewed, patient examined, no change in status, stable for surgery.  I have reviewed the patient's chart and labs.  Questions were answered to the patient's satisfaction.     Ladislav Caselli D

## 2022-01-02 NOTE — TOC Transition Note (Signed)
Transition of Care [facility]) - CM/SW Discharge Note   Patient Details  Name: Joshua Leon. MRN: [id number] Date of Birth: 08/12/1995  [facility] of [facility] CM/SW Contact:  Otelia Santee, LCSW Phone Number: 01/02/2022, 12:11 PM   Clinical Narrative:    Pt noted to have no insurance and no PCP. Pt agreeable to having PCP appointment scheduled.   An appointment at [facility] has been scheduled on 01/30/22 at 10:30am. If an earlier appointment comes available pt will be called with option to reschedule.    Final next level of care: Home/Self Care Barriers to Discharge: No Barriers Identified   Patient Goals and CMS Choice Patient states their goals for this hospitalization and ongoing recovery are:: To return home   Choice offered to / list presented to : Patient  Discharge Placement                       Discharge Plan and Services                DME Arranged: N/A DME Agency: NA                  Social Determinants of Health (SDOH) Interventions     Readmission Risk Interventions     No data to display

## 2022-01-03 ENCOUNTER — Encounter (HOSPITAL_COMMUNITY): Payer: Self-pay | Admitting: Gastroenterology

## 2022-01-03 DIAGNOSIS — K226 Gastro-esophageal laceration-hemorrhage syndrome: Secondary | ICD-10-CM

## 2022-01-03 LAB — BASIC METABOLIC PANEL
Anion gap: 7 (ref 5–15)
BUN: 9 mg/dL (ref 6–20)
CO2: 26 mmol/L (ref 22–32)
Calcium: 8.3 mg/dL — ABNORMAL LOW (ref 8.9–10.3)
Chloride: 106 mmol/L (ref 98–111)
Creatinine, Ser: 0.76 mg/dL (ref 0.61–1.24)
GFR, Estimated: >60 mL/min (ref >60)
Glucose, Bld: 92 mg/dL (ref 70–99)
Potassium: 3.7 mmol/L (ref 3.5–5.1)
Sodium: 139 mmol/L (ref 135–145)

## 2022-01-03 LAB — CBC WITH DIFFERENTIAL/PLATELET
Abs Immature Granulocytes: 0.01 10*3/uL (ref 0.00–0.07)
Basophils Absolute: 0 10*3/uL (ref 0.0–0.1)
Basophils Relative: 0 %
Eosinophils Absolute: 0 10*3/uL (ref 0.0–0.5)
Eosinophils Relative: 1 %
HCT: 26 % — ABNORMAL LOW (ref 39.0–52.0)
Hemoglobin: 8.9 g/dL — ABNORMAL LOW (ref 13.0–17.0)
Immature Granulocytes: 0 %
Lymphocytes Relative: 45 %
Lymphs Abs: 2.3 10*3/uL (ref 0.7–4.0)
MCH: 30.1 pg (ref 26.0–34.0)
MCHC: 34.2 g/dL (ref 30.0–36.0)
MCV: 87.8 fL (ref 80.0–100.0)
Monocytes Absolute: 0.4 10*3/uL (ref 0.1–1.0)
Monocytes Relative: 8 %
Neutro Abs: 2.3 10*3/uL (ref 1.7–7.7)
Neutrophils Relative %: 46 %
Platelets: 149 10*3/uL — ABNORMAL LOW (ref 150–400)
RBC: 2.96 MIL/uL — ABNORMAL LOW (ref 4.22–5.81)
RDW: 12.7 % (ref 11.5–15.5)
WBC: 5 10*3/uL (ref 4.0–10.5)
nRBC: 0 % (ref 0.0–0.2)

## 2022-01-03 LAB — MAGNESIUM: Magnesium: 2 mg/dL (ref 1.7–2.4)

## 2022-01-03 MED ORDER — PANTOPRAZOLE SODIUM 40 MG PO TBEC
40.0000 mg | DELAYED_RELEASE_TABLET | Freq: Every day | ORAL | Status: DC
Start: 2022-01-03 — End: 2022-01-03

## 2022-01-03 MED ORDER — PANTOPRAZOLE SODIUM 40 MG PO TBEC: 40.0000 mg | DELAYED_RELEASE_TABLET | Freq: Every day | ORAL | 0 refills | Status: DC

## 2022-01-03 MED ORDER — NIRMATRELVIR/RITONAVIR (PAXLOVID)TABLET: 3.0000 | ORAL_TABLET | Freq: Two times a day (BID) | ORAL | 0 refills | Status: AC

## 2022-01-03 NOTE — Progress Notes (Signed)
AVS reviewed. Remaining rx for paxlovid given to patient. Patient verbalizes understanding of medication regimen as well as necessary follow up appointments.

## 2022-01-03 NOTE — Anesthesia Postprocedure Evaluation (Signed)
Anesthesia Post Note  Patient: Euclid Cassetta.  Procedure(s) Performed: ESOPHAGOGASTRODUODENOSCOPY (EGD) WITH PROPOFOL HEMOSTASIS CLIP PLACEMENT     Patient location during evaluation: Endoscopy Anesthesia Type: MAC Level of consciousness: oriented, awake and alert and awake Pain management: pain level controlled Vital Signs Assessment: post-procedure vital signs reviewed and stable Respiratory status: spontaneous breathing, nonlabored ventilation, respiratory function stable and patient connected to nasal cannula oxygen Cardiovascular status: blood pressure returned to baseline and stable Postop Assessment: no headache, no backache and no apparent nausea or vomiting Anesthetic complications: no   No notable events documented.  Last Vitals:  Vitals:   01/02/22 2031 01/03/22 0541  BP: 135/65 118/67  Pulse: 62 (!) 53  Resp: 18 18  Temp: 37.1 C 36.8 C  SpO2: 100% 100%    Last Pain:  Vitals:   01/03/22 0541  TempSrc: Oral  PainSc: 0-No pain                 Santa Lighter

## 2022-01-03 NOTE — Discharge Summary (Signed)
Physician Discharge Summary   Patient: [facility]. MRN: [id number] DOB: 01-Jan-1996  Admit date:     01/01/2022  Discharge date: 01/03/22  Discharge Physician: Elmarie Shiley   PCP: Patient, No Pcp Per   Recommendations at discharge:   Follow up with ID for further care HIV.    Discharge Diagnoses: Principal Problem:   GIB (gastrointestinal bleeding) Active Problems:   Mallory-Weiss tear   Human immunodeficiency virus (HIV) disease ([place])   COVID-19 virus infection  Resolved Problems:   * No resolved hospital problems. *  Hospital Course: 26 year old with past medical history significant for HIV presents complaining of dark stool, nausea and vomiting.  She states that the morning of admission on her way to work she developed nausea followed by episode of vomiting with a small amount of blood.  Patient was found to be positive for COVID.  Chest x-ray no acute abnormality. He was evaluated by GI and underwent endoscopy on 80/80/2023.  10 mm nonbleeding Mallory-Weiss tear noted treated with 4 clips.  2 cm hiatal hernia also noted.  Patient was a started on PPI for 1 months per GI recommendation. Started on Paxlovid for COVID 19 virus infection.  Appears to be asymptomatic from Respiratory symptoms currently.  Assessment and Plan: 1-GI bleed, secondary to Mallory-Weiss tear Presented with nausea vomiting, hematemesis.  Hemoglobin on admission at 12 from a baseline of 16.  Subsequently hemoglobin has remained stable around 9. He was started on PPI, will provide refill for 1 month. He underwent endoscopy 80/80/2023.  10 mm nonbleeding Mallory-Weiss tear noted treated with 4 clips.  2-COVID-19 virus infection: Screening positive on admission.  GI symptoms may be associated.  He does not have any respiratory symptoms.  He was started on Paxlovid.  He will complete treatment.  3-human immunodeficiency virus Thank you 2 months Cabenuva.  Last CD4 581.  Continue to follow-up with  Dr. Everlene Balls       Consultants: Dr Benson Norway  Procedures performed: Endoscopy  Disposition: Home Diet recommendation:  Discharge Diet Orders (From admission, onward)     Start     Ordered   01/03/22 0000  Diet - low sodium heart healthy        01/03/22 1213           Cardiac diet DISCHARGE MEDICATION: Allergies as of 01/03/2022   No Known Allergies      Medication List     TAKE these medications    cabotegravir & rilpivirine ER 600 & 900 MG/3ML injection Commonly known as: CABENUVA Inject 1 kit into the muscle every 2 (two) months.   nirmatrelvir/ritonavir EUA 20 x 150 MG & 10 x 100MG Tabs Commonly known as: PAXLOVID Take 3 tablets by mouth 2 (two) times daily for 5 days. Take nirmatrelvir (150 mg) two tablets twice daily for 5 days and ritonavir (100 mg) one tablet twice daily for 5 days.   pantoprazole 40 MG tablet Commonly known as: PROTONIX Take 1 tablet (40 mg total) by mouth daily. Start taking on: January 04, 2022        Follow-up [facility] Follow up.   Why: You have a hospital follow up appointment to establish primary care services on Tuesday, 01/30/22 at 10:30am . Please arrive 15 minutes early to complete new patient paperwork. If an earlier appointment comes available you will receive a call to reschedule appointment. Contact information: [address] [place]  [phone]               Discharge Exam: Filed Weights   01/01/22 1503 01/02/22 1542  Weight: 91.4 kg 91.4 kg   General; NAD Lung; CTA  Condition at discharge: stable  The results of significant diagnostics from this hospitalization (including imaging, microbiology, ancillary and laboratory) are listed below for reference.   Imaging Studies: DG Chest 2 View  Result Date: 01/01/2022 CLINICAL DATA:  Cough EXAM: CHEST - 2 VIEW COMPARISON:  Chest x-ray 06/23/2020. FINDINGS: The heart size and  mediastinal contours are within normal limits. Both lungs are clear. No visible pleural effusions or pneumothorax. No acute osseous abnormality. IMPRESSION: No active cardiopulmonary disease. Electronically Signed   By: Margaretha Sheffield M.D.   On: 01/01/2022 10:30    Microbiology: Results for orders placed or performed during the hospital encounter of 01/01/22  SARS Coronavirus 2 by RT PCR (hospital order, performed in [facility] hospital lab) *cepheid single result test* Anterior Nasal Swab     Status: Abnormal   Collection Time: 01/01/22 12:03 PM   Specimen: Anterior Nasal Swab  Result Value Ref Range Status   SARS Coronavirus 2 by RT PCR POSITIVE (A) NEGATIVE Final    Comment: (NOTE) SARS-CoV-2 target nucleic acids are DETECTED  SARS-CoV-2 RNA is generally detectable in upper respiratory specimens  during the acute phase of infection.  Positive results are indicative  of the presence of the identified virus, but do not rule out bacterial infection or co-infection with other pathogens not detected by the test.  Clinical correlation with patient history and  other diagnostic information is necessary to determine patient infection status.  The expected result is negative.  Fact Sheet for Patients:   https://www.patel.info/   Fact Sheet for Healthcare Providers:   https://hall.com/    This test is not yet approved or cleared by the [place] FDA and  has been authorized for detection and/or diagnosis of SARS-CoV-2 by FDA under an Emergency Use Authorization (EUA).  This EUA will remain in effect (meaning this test can be used) for the duration of  the COVID-19 declaration under Section 564(b)(1)  of the Act, 21 U.S.C. section 360-bbb-3(b)(1), unless the authorization is terminated or revoked sooner.   Performed at [facility], [address], [place], [place] [address]     Labs: CBC: Recent Labs  Lab  01/01/22 1010 01/02/22 0507 01/03/22 0511  WBC 4.7 3.9* 5.0  NEUTROABS 2.7 1.0* 2.3  HGB 12.1* 9.0* 8.9*  HCT 35.0* 26.6* 26.0*  MCV 85.8 87.8 87.8  PLT 165 118* 761*   Basic Metabolic Panel: Recent Labs  Lab 01/01/22 1010 01/02/22 0507 01/03/22 0511  NA 140 139 139  K 3.8 3.7 3.7  CL 104 106 106  CO2 '27 28 26  ' GLUCOSE 104* 102* 92  BUN 31* 18 9  CREATININE 0.97 0.99 0.76  CALCIUM 8.9 8.1* 8.3*  MG  --  1.9 2.0   Liver Function Tests: Recent Labs  Lab 01/01/22 1010  AST 19  ALT 16  ALKPHOS 40  BILITOT 0.4  PROT 7.7  ALBUMIN 4.6   CBG: No results for input(s): "GLUCAP" in the last 168 hours.  Discharge time spent: greater than 30 minutes.  Signed: Elmarie Shiley, MD [facility] 01/03/2022

## 2022-01-04 ENCOUNTER — Other Ambulatory Visit: Payer: Self-pay | Admitting: Pharmacist

## 2022-01-04 DIAGNOSIS — B20 Human immunodeficiency virus [HIV] disease: Secondary | ICD-10-CM

## 2022-01-04 MED ORDER — CABOTEGRAVIR & RILPIVIRINE ER 600 & 900 MG/3ML IM SUER: 1.0000 | INTRAMUSCULAR | 5 refills | Status: DC

## 2022-01-11 ENCOUNTER — Telehealth: Payer: Self-pay

## 2022-01-11 NOTE — Telephone Encounter (Signed)
[facility] Patient Advocate Encounter  Patient's medications have been couriered to [facility] from [facility]: [phone], and will be administered on  01/17/2022.

## 2022-01-17 ENCOUNTER — Ambulatory Visit (INDEPENDENT_AMBULATORY_CARE_PROVIDER_SITE_OTHER): Payer: Self-pay | Admitting: Internal Medicine

## 2022-01-17 ENCOUNTER — Other Ambulatory Visit: Payer: Self-pay

## 2022-01-17 ENCOUNTER — Encounter: Payer: Self-pay | Admitting: Internal Medicine

## 2022-01-17 VITALS — BP 134/73 | HR 56 | Temp 97.7°F | Ht 69.0 in | Wt 197.0 lb

## 2022-01-17 DIAGNOSIS — F649 Gender identity disorder, unspecified: Secondary | ICD-10-CM

## 2022-01-17 DIAGNOSIS — B20 Human immunodeficiency virus [HIV] disease: Secondary | ICD-10-CM

## 2022-01-17 MED ORDER — ESTRADIOL VALERATE 10 MG/ML IM OIL
10.0000 mg | TOPICAL_OIL | INTRAMUSCULAR | 25 refills | Status: DC
Start: 2022-01-17 — End: 2023-05-01

## 2022-01-17 MED ORDER — CABOTEGRAVIR & RILPIVIRINE ER 600 & 900 MG/3ML IM SUER
1.0000 | Freq: Once | INTRAMUSCULAR | Status: AC
  Administered 2022-01-17: 1 via INTRAMUSCULAR

## 2022-01-17 NOTE — Assessment & Plan Note (Addendum)
Doing well on Cabenuva and dose given today.  No changes and has follow up in October scheduled.  Labs done earlier this month.    I have personally spent 40 minutes involved in face-to-face and non-face-to-face activities for this patient on the day of the visit. Professional time spent includes the following activities: Preparing to see the patient (review of tests), Obtaining and/or reviewing separately obtained history (admission/discharge record), Performing a medically appropriate examination and/or evaluation , Ordering medications/tests/procedures, referring and communicating with other health care professionals, Documenting clinical information in the EMR, Independently interpreting results (not separately reported), Communicating results to the patient/family/caregiver, Counseling and educating the patient/family/caregiver and Care coordination (not separately reported).

## 2022-01-17 NOTE — Progress Notes (Signed)
   Subjective:    Patient ID: Joshua Hoffmann., adult    DOB: [birth year], 26 y.o.   MRN: [id number]  HPI Here for follow up of HIV and hsfu He was hospitalized earlier this month with COVID and GI bleed from a Mallory-Weiss tear and now recovering well.  He continues on [place] and due today for his next dose.  Recent viral load < 20.  No complaints from [place].  She is also interested in hormone therapy for her transition MTF.   Review of Systems  Constitutional:  Negative for fatigue.  Gastrointestinal:  Negative for diarrhea.  Skin:  Negative for rash.       Objective:   Physical Exam Eyes:     General: No scleral icterus. Pulmonary:     Effort: Pulmonary effort is normal.  Skin:    Findings: No rash.  Neurological:     Mental Status: She is alert.           Assessment & Plan:

## 2022-01-17 NOTE — Assessment & Plan Note (Signed)
I discussed hormone therapy and will start with 10 mg of estradiol IM and will schedule a nurse visit.  Discussed it will be every 2 weeks.  Follow up in 2 months.  Can consider a dose increase and spironalactone, as appropriate.

## 2022-01-30 ENCOUNTER — Inpatient Hospital Stay: Payer: Medicaid Other | Admitting: Critical Care Medicine

## 2022-01-30 NOTE — Progress Notes (Unsigned)
New Patient Office Visit  Subjective    Patient ID: [facility]., adult    DOB: 02-16-1996  Age: 26 y.o. MRN: [id number]  CC: No chief complaint on file.   HPI [facility]. presents to establish care  Recommendations at discharge:    Follow up with ID for further care HIV.      Discharge Diagnoses: Principal Problem:   GIB (gastrointestinal bleeding) Active Problems:   Mallory-Weiss tear   Human immunodeficiency virus (HIV) disease ([place])   COVID-19 virus infection   Resolved Problems:   * No resolved hospital problems. *   Hospital Course: 26 year old with past medical history significant for HIV presents complaining of dark stool, nausea and vomiting.  She states that the morning of admission on her way to work she developed nausea followed by episode of vomiting with a small amount of blood.  Patient was found to be positive for COVID.  Chest x-ray no acute abnormality. He was evaluated by GI and underwent endoscopy on 80/80/2023.  10 mm nonbleeding Mallory-Weiss tear noted treated with 4 clips.  2 cm hiatal hernia also noted.  Patient was a started on PPI for 1 months per GI recommendation. Started on Paxlovid for COVID 19 virus infection.  Appears to be asymptomatic from Respiratory symptoms currently.   Assessment and Plan: 1-GI bleed, secondary to Mallory-Weiss tear Presented with nausea vomiting, hematemesis.  Hemoglobin on admission at 12 from a baseline of 16.  Subsequently hemoglobin has remained stable around 9. He was started on PPI, will provide refill for 1 month. He underwent endoscopy 80/80/2023.  10 mm nonbleeding Mallory-Weiss tear noted treated with 4 clips.   2-COVID-19 virus infection: Screening positive on admission.  GI symptoms may be associated.  He does not have any respiratory symptoms.  He was started on Paxlovid.  He will complete treatment.   3-human immunodeficiency virus Thank you 2 months Cabenuva.  Last CD4 581.  Continue  to follow-up with Dr. Everlene Leon           Consultants: Dr Benson Norway  Procedures performed: Endoscopy  Doing well on Cabenuva and dose given today.  No changes and has follow up in October scheduled.  Labs done earlier this month.   I discussed hormone therapy and will start with 10 mg of estradiol IM and will schedule a nurse visit.  Discussed it will be every 2 weeks.  Follow up in 2 months.  Can consider a dose increase and spironalactone, as appropriate.   Outpatient Encounter Medications as of 01/31/2022  Medication Sig   cabotegravir & rilpivirine ER (CABENUVA) 600 & 900 MG/3ML injection Inject 1 kit into the muscle every 2 (two) months.   Estradiol Valerate 10 MG/ML OIL Inject 1 mL (10 mg total) into the muscle every 14 (fourteen) days.   No facility-administered encounter medications on file as of 01/31/2022.    Past Medical History:  Diagnosis Date   Pneumonia     Past Surgical History:  Procedure Laterality Date   ESOPHAGOGASTRODUODENOSCOPY (EGD) WITH PROPOFOL N/A 01/02/2022   Procedure: ESOPHAGOGASTRODUODENOSCOPY (EGD) WITH PROPOFOL;  Surgeon: Joshua Ada, MD;  Location: [facility];  Service: Gastroenterology;  Laterality: N/A;  Hematememesis, anemia, blood in stool.I   HEMOSTASIS CLIP PLACEMENT  01/02/2022   Procedure: HEMOSTASIS CLIP PLACEMENT;  Surgeon: Joshua Ada, MD;  Location: [facility];  Service: Gastroenterology;;    Family History  Problem Relation Age of Onset   Graves' disease Mother    Cancer Neg Hx  Diabetes Neg Hx    Heart failure Neg Hx    Hyperlipidemia Neg Hx     Social History   Socioeconomic History   Marital status: Single    Spouse name: Not on file   Number of children: Not on file   Years of education: Not on file   Highest education level: Not on file  Occupational History   Not on file  Tobacco Use   Smoking status: Some Days    Types: Cigars   Smokeless tobacco: Never  Vaping Use   Vaping Use: Never used  Substance and Sexual  Activity   Alcohol use: Yes    Comment: ocassionally   Drug use: Yes    Types: Marijuana   Sexual activity: Yes    Partners: Female, Male    Comment: accepted condoms  Other Topics Concern   Not on file  Social History Narrative   Not on file   Social Determinants of Health   Financial Resource Strain: Not on file  Food Insecurity: Not on file  Transportation Needs: Not on file  Physical Activity: Not on file  Stress: Not on file  Social Connections: Not on file  Intimate Partner Violence: Not on file    ROS      Objective    There were no vitals taken for this visit.  Physical Exam  {Labs (Optional):23779}    Assessment & Plan:   Problem List Items Addressed This Visit   None   No follow-ups on file.   [facility], MD

## 2022-01-31 ENCOUNTER — Ambulatory Visit: Payer: Medicaid Other | Attending: Critical Care Medicine | Admitting: Critical Care Medicine

## 2022-01-31 ENCOUNTER — Encounter: Payer: Self-pay | Admitting: Critical Care Medicine

## 2022-01-31 VITALS — BP 123/70 | HR 78 | Ht 69.0 in | Wt 197.4 lb

## 2022-01-31 DIAGNOSIS — K922 Gastrointestinal hemorrhage, unspecified: Secondary | ICD-10-CM

## 2022-01-31 DIAGNOSIS — F649 Gender identity disorder, unspecified: Secondary | ICD-10-CM

## 2022-01-31 DIAGNOSIS — B20 Human immunodeficiency virus [HIV] disease: Secondary | ICD-10-CM

## 2022-01-31 DIAGNOSIS — K226 Gastro-esophageal laceration-hemorrhage syndrome: Secondary | ICD-10-CM

## 2022-01-31 DIAGNOSIS — U071 COVID-19: Secondary | ICD-10-CM

## 2022-01-31 NOTE — Assessment & Plan Note (Signed)
Patient tolerating every 55-month injectable therapy we will continue same per infectious disease

## 2022-01-31 NOTE — Assessment & Plan Note (Signed)
As per gastrointestinal bleeding assessment

## 2022-01-31 NOTE — Patient Instructions (Signed)
Labs today include metabolic panel and blood counts  Return visit with Dr. Elnoria Howard of gastroenterology will be made we have sent the referral  Keep your follow-up appointments with infectious disease  Return to Dr. Delford Field 4 months

## 2022-01-31 NOTE — Assessment & Plan Note (Signed)
No further GI bleeding we will assess complete blood counts at this visit and refer back to gastroenterology for further management and follow-up

## 2022-01-31 NOTE — Assessment & Plan Note (Signed)
The patient identifies as male wishing to transition gender to male status and has estrogen therapy which is to be started today in the infectious disease clinic

## 2022-02-01 ENCOUNTER — Other Ambulatory Visit: Payer: Self-pay

## 2022-02-01 ENCOUNTER — Other Ambulatory Visit: Payer: Self-pay | Admitting: Internal Medicine

## 2022-02-01 ENCOUNTER — Ambulatory Visit: Payer: Self-pay

## 2022-02-01 LAB — COMPREHENSIVE METABOLIC PANEL
ALT: 20 IU/L (ref 0–44)
AST: 20 IU/L (ref 0–40)
Albumin/Globulin Ratio: 1.6 (ref 1.2–2.2)
Albumin: 5 g/dL (ref 4.3–5.2)
Alkaline Phosphatase: 66 IU/L (ref 44–121)
BUN/Creatinine Ratio: 10 (ref 9–20)
BUN: 11 mg/dL (ref 6–20)
Bilirubin Total: 0.5 mg/dL (ref 0.0–1.2)
CO2: 25 mmol/L (ref 20–29)
Calcium: 9.7 mg/dL (ref 8.7–10.2)
Chloride: 101 mmol/L (ref 96–106)
Creatinine, Ser: 1.07 mg/dL (ref 0.76–1.27)
Globulin, Total: 3.1 g/dL (ref 1.5–4.5)
Glucose: 87 mg/dL (ref 70–99)
Potassium: 4.4 mmol/L (ref 3.5–5.2)
Sodium: 140 mmol/L (ref 134–144)
Total Protein: 8.1 g/dL (ref 6.0–8.5)
eGFR: 99 mL/min/{1.73_m2} (ref >59)

## 2022-02-01 LAB — CBC WITH DIFFERENTIAL/PLATELET
Basophils Absolute: 0 10*3/uL (ref 0.0–0.2)
Basos: 1 %
EOS (ABSOLUTE): 0.1 10*3/uL (ref 0.0–0.4)
Eos: 3 %
Hematocrit: 37.1 % — ABNORMAL LOW (ref 37.5–51.0)
Hemoglobin: 12 g/dL — ABNORMAL LOW (ref 13.0–17.7)
Immature Grans (Abs): 0 10*3/uL (ref 0.0–0.1)
Immature Granulocytes: 0 %
Lymphocytes Absolute: 2 10*3/uL (ref 0.7–3.1)
Lymphs: 49 %
MCH: 27.3 pg (ref 26.6–33.0)
MCHC: 32.3 g/dL (ref 31.5–35.7)
MCV: 85 fL (ref 79–97)
Monocytes Absolute: 0.4 10*3/uL (ref 0.1–0.9)
Monocytes: 9 %
Neutrophils Absolute: 1.5 10*3/uL (ref 1.4–7.0)
Neutrophils: 38 %
Platelets: 239 10*3/uL (ref 150–450)
RBC: 4.39 x10E6/uL (ref 4.14–5.80)
RDW: 13.7 % (ref 11.6–15.4)
WBC: 4 10*3/uL (ref 3.4–10.8)

## 2022-02-01 MED ORDER — NEEDLES & SYRINGES MISC
1.0000 [IU] | 11 refills | Status: DC
Start: 2022-02-01 — End: 2023-05-07

## 2022-02-01 NOTE — Progress Notes (Signed)
Let pt know blood counts back to normal no bleeding seen, kidney liver normal

## 2022-02-01 NOTE — Progress Notes (Signed)
Patient came in the office today for delestrogen injection and teaching. Right thigh was cleansed with alcohol swab and 1 ml of delestrogen administered into right thigh. Patient tolerated injection well. Patient aware next dose will be due in 14 days. Patient verbalized her understanding.    [facility] Lesli Albee, CMA

## 2022-02-02 ENCOUNTER — Telehealth: Payer: Self-pay

## 2022-02-02 NOTE — Telephone Encounter (Signed)
Pt was called and is aware of results, DOB was confirmed.  ?

## 2022-02-02 NOTE — Telephone Encounter (Signed)
She was told in the visit to fu with GI dr hung i sent referral at visit he saw here in hospital and put the staples  in place

## 2022-02-02 NOTE — Telephone Encounter (Signed)
Called patient and she is aware of doctors note,I gave her the address and number to dr.hung office from referral.

## 2022-02-02 NOTE — Telephone Encounter (Signed)
-----   Message from Storm Frisk, MD sent at 02/01/2022  6:16 AM EDT ----- Let pt know blood counts back to normal no bleeding seen, kidney liver normal

## 2022-03-06 ENCOUNTER — Other Ambulatory Visit: Payer: Medicaid Other

## 2022-03-08 ENCOUNTER — Telehealth: Payer: Self-pay

## 2022-03-08 NOTE — Telephone Encounter (Signed)
[facility] Patient Advocate Encounter  Patient's medication Kern Reap) have been couriered to [facility] from [facility] and will be administered on the patient next office visit on 03/15/22.  Ileene Patrick , [facility] Patient [facility] for Infectious Disease Phone: [phone] Fax:  [phone]

## 2022-03-15 ENCOUNTER — Other Ambulatory Visit: Payer: Self-pay

## 2022-03-15 ENCOUNTER — Ambulatory Visit (INDEPENDENT_AMBULATORY_CARE_PROVIDER_SITE_OTHER): Payer: Self-pay | Admitting: Pharmacist

## 2022-03-15 DIAGNOSIS — Z113 Encounter for screening for infections with a predominantly sexual mode of transmission: Secondary | ICD-10-CM

## 2022-03-15 DIAGNOSIS — Z23 Encounter for immunization: Secondary | ICD-10-CM

## 2022-03-15 DIAGNOSIS — B20 Human immunodeficiency virus [HIV] disease: Secondary | ICD-10-CM

## 2022-03-15 DIAGNOSIS — A539 Syphilis, unspecified: Secondary | ICD-10-CM

## 2022-03-15 MED ORDER — CABOTEGRAVIR & RILPIVIRINE ER 600 & 900 MG/3ML IM SUER
1.0000 | Freq: Once | INTRAMUSCULAR | Status: AC
  Administered 2022-03-15: 1 via INTRAMUSCULAR

## 2022-03-15 NOTE — Progress Notes (Signed)
HPI: Joshua Leon. is a 26 y.o. adult who presents to the [place] [place] clinic for [place] administration.  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   ESTRADIOL VALERATE 10 MG/ML OIL    Inject 1 mL (10 mg total) into the muscle every 14 (fourteen) days.   NEEDLES & SYRINGES MISC    1 Units by Does not apply route every 14 (fourteen) days.  Modified Medications   No medications on file  Discontinued Medications   No medications on file    Allergies: No Known Allergies  Past Medical History: Past Medical History:  Diagnosis Date   Pneumonia     Social History: Social History   Socioeconomic History   Marital status: Single    Spouse name: Not on file   Number of children: Not on file   Years of education: Not on file   Highest education level: Not on file  Occupational History   Not on file  Tobacco Use   Smoking status: Some Days    Types: Cigars   Smokeless tobacco: Never  Vaping Use   Vaping Use: Never used  Substance and Sexual Activity   Alcohol use: Yes    Comment: ocassionally   Drug use: Yes    Types: Marijuana   Sexual activity: Yes    Partners: Female, Male    Comment: accepted condoms  Other Topics Concern   Not on file  Social History Narrative   Not on file   Social Determinants of Health   Financial Resource Strain: Not on file  Food Insecurity: Not on file  Transportation Needs:  Not on file  Physical Activity: Not on file  Stress: Not on file  Social Connections: Not on file    Labs: Lab Results  Component Value Date   HIV1RNAQUANT <20 01/01/2022   HIV1RNAQUANT Not Detected 11/16/2021   HIV1RNAQUANT 138 (H) 10/18/2021   CD4TABS 817 01/01/2022   CD4TABS 581 08/31/2021    RPR and STI Lab Results  Component Value Date   LABRPR REACTIVE (A) 10/18/2021   LABRPR NON-REACTIVE 08/31/2021   LABRPR NON-REACTIVE 04/28/2021   RPRTITER 1:16 (H) 10/18/2021    STI Results GC CT  11/16/2021 11:40 AM Negative    Negative    Negative  Negative    Negative    Negative   10/18/2021  3:30 PM Other    Negative  Other    Negative   08/31/2021  9:07 AM Negative    Positive  Negative    Negative   04/28/2021 11:01 AM Negative    Negative  Negative    Negative     Hepatitis B Lab Results  Component Value Date   HEPBSAB NON-REACTIVE 04/28/2021   HEPBSAG NON-REACTIVE 04/28/2021   HEPBCAB NON-REACTIVE 04/28/2021   Hepatitis C Lab Results  Component Value Date   HEPCAB NON-REACTIVE 04/28/2021   Hepatitis A Lab Results  Component Value Date   HAV REACTIVE (A) 04/28/2021  Lipids: Lab Results  Component Value Date   CHOL 207 (H) 04/28/2021   TRIG 130 04/28/2021   HDL 62 04/28/2021   CHOLHDL 3.3 04/28/2021   LDLCALC 120 (H) 04/28/2021    TARGET DATE: 24th  Assessment: [facility] presents today for her maintenance Cabenuva injections. Past injections were tolerated well without issues. Last HIV-RNA in August was undetectable. [facility] requests 3 site STI screening as their condom broke during a sexual encounter. She would also like to receive a flu vaccine. She also is interested in the dental clinic at [facility]. She would like to defer receiving the updated COVID-19 vaccine at this time.   Administered cabotegravir 671m/3mL in left upper outer quadrant of the gluteal muscle. Administered rilpivirine 900 mg/372min the right upper outer quadrant of the gluteal  muscle. No issues with injections. She will follow up in 2 months for next set of injections.  Plan: - Cabenuva injections administered - Check HIV-RNA  - Administered annual flu vaccine  - Check 3 site cytologies, RPR  - Next injections scheduled for 05/15/22 with Dr. CoLinus Salmons02/20/24 with AmEstill Bamberg Call with any issues or questions  CoEliseo GumPharmD PGY1 Pharmacy Resident   03/15/2022  4:51 PM

## 2022-03-16 LAB — URINE CYTOLOGY ANCILLARY ONLY
Chlamydia: NEGATIVE
Comment: NEGATIVE
Comment: NORMAL
Neisseria Gonorrhea: NEGATIVE

## 2022-03-16 LAB — CYTOLOGY, (ORAL, ANAL, URETHRAL) ANCILLARY ONLY
Chlamydia: NEGATIVE
Chlamydia: NEGATIVE
Comment: NEGATIVE
Comment: NEGATIVE
Comment: NORMAL
Comment: NORMAL
Neisseria Gonorrhea: NEGATIVE
Neisseria Gonorrhea: NEGATIVE

## 2022-03-18 LAB — RPR: RPR Ser Ql: REACTIVE — AB

## 2022-03-18 LAB — HIV-1 RNA QUANT-NO REFLEX-BLD
HIV 1 RNA Quant: NOT DETECTED Copies/mL
HIV-1 RNA Quant, Log: NOT DETECTED Log cps/mL

## 2022-03-18 LAB — FLUORESCENT TREPONEMAL AB(FTA)-IGG-BLD: Fluorescent Treponemal ABS: REACTIVE — AB

## 2022-03-18 LAB — RPR TITER: RPR Titer: 1:4 {titer} — ABNORMAL HIGH

## 2022-03-20 ENCOUNTER — Encounter: Payer: Medicaid Other | Admitting: Internal Medicine

## 2022-04-27 ENCOUNTER — Telehealth: Payer: Self-pay

## 2022-04-27 ENCOUNTER — Ambulatory Visit: Payer: Medicaid Other

## 2022-04-27 ENCOUNTER — Other Ambulatory Visit: Payer: Self-pay | Admitting: Internal Medicine

## 2022-04-27 DIAGNOSIS — R369 Urethral discharge, unspecified: Secondary | ICD-10-CM | POA: Insufficient documentation

## 2022-04-27 MED ORDER — AZITHROMYCIN 250 MG PO TABS: 1000.0000 mg | ORAL_TABLET | Freq: Once | ORAL | Status: DC

## 2022-04-27 MED ORDER — CEFTRIAXONE SODIUM 500 MG IJ SOLR: 500.0000 mg | Freq: Once | INTRAMUSCULAR | Status: DC

## 2022-04-27 NOTE — Progress Notes (Signed)
Patient seen for nurse visit  

## 2022-04-27 NOTE — Telephone Encounter (Signed)
Patient originally scheduled at 11:30 Am 12/1 with Dr Luciana Axe called demanding to be seen due to STI symptoms. Per Dr Luciana Axe ok to see as nurse visit as he ordered treatment plan of Rocephin 500 mg and Azythrom. Pill 1 G.  Patient promised to be in clinic by 11:50 (stating 10 min away) CeCe called at 12:05 and she said she can arrive by 12:12. I called at 12:26 and advised UC or come Monday at 1045 with Earlene Plater agreed to come Monday.

## 2022-04-30 ENCOUNTER — Ambulatory Visit (INDEPENDENT_AMBULATORY_CARE_PROVIDER_SITE_OTHER): Payer: Self-pay | Admitting: Internal Medicine

## 2022-04-30 ENCOUNTER — Encounter: Payer: Self-pay | Admitting: Internal Medicine

## 2022-04-30 ENCOUNTER — Other Ambulatory Visit: Payer: Self-pay

## 2022-04-30 VITALS — BP 129/78 | HR 65 | Temp 98.8°F | Ht 69.0 in | Wt 209.0 lb

## 2022-04-30 DIAGNOSIS — A539 Syphilis, unspecified: Secondary | ICD-10-CM

## 2022-04-30 DIAGNOSIS — R369 Urethral discharge, unspecified: Secondary | ICD-10-CM

## 2022-04-30 DIAGNOSIS — B20 Human immunodeficiency virus [HIV] disease: Secondary | ICD-10-CM

## 2022-04-30 MED ORDER — CEFTRIAXONE SODIUM 500 MG IJ SOLR
500.0000 mg | Freq: Once | INTRAMUSCULAR | Status: AC
  Administered 2022-04-30: 500 mg via INTRAMUSCULAR

## 2022-04-30 MED ORDER — AZITHROMYCIN 250 MG PO TABS
1000.0000 mg | ORAL_TABLET | Freq: Once | ORAL | Status: AC
  Administered 2022-04-30: 1000 mg via ORAL

## 2022-04-30 NOTE — Assessment & Plan Note (Signed)
HIV well controlled on current regimen.  Follow up with Dr Luciana Axe.

## 2022-04-30 NOTE — Assessment & Plan Note (Signed)
Patient with symptoms of penile discharge.  Will check GC/CT x 3 today and treat with CTX 500mg  IM and azithromycin 1gm x 1 dose PO.  Follow up pending results from today if needed.

## 2022-04-30 NOTE — Addendum Note (Signed)
Addended by: Juanita Laster on: 04/30/2022 11:40 AM   Modules accepted: Orders

## 2022-04-30 NOTE — Progress Notes (Signed)
[facility] for Infectious Disease  CHIEF COMPLAINT:    Follow up for STI  SUBJECTIVE:    Joshua Leon. is a 26 y.o. adult with PMHx as below who presents to the clinic for STI visit.   Patient is followed by Dr Linus Salmons for HIV and on [place].  Notes from 12/1 indicate patient was scheduled that day for STI symptoms of drainage from penis.  Scheduled as nurse visit for ceftriaxone 59m IM and Azithromycin 1gm PO that day but did not make appointment and presents today.  Reports about 1 week ago began having penile discharge.  Similar to prior gonorrhea infection that was treated.  Patient reports 1 partner but recently condom broke and their partner likely has sex with other people as well.   Please see A&P for the details of today's visit and status of the patient's medical problems.   Patient's Medications  New Prescriptions   No medications on file  Previous Medications   CABOTEGRAVIR & RILPIVIRINE ER (CABENUVA) 600 & 900 MG/3ML INJECTION    Inject 1 kit into the muscle every 2 (two) months.   ESTRADIOL VALERATE 10 MG/ML OIL    Inject 1 mL (10 mg total) into the muscle every 14 (fourteen) days.   NEEDLES & SYRINGES MISC    1 Units by Does not apply route every 14 (fourteen) days.  Modified Medications   No medications on file  Discontinued Medications   No medications on file      Past Medical History:  Diagnosis Date   Pneumonia     Social History   Tobacco Use   Smoking status: Former    Types: Cigars   Smokeless tobacco: Never  Vaping Use   Vaping Use: Never used  Substance Use Topics   Alcohol use: Yes    Comment: ocassionally   Drug use: Yes    Types: Marijuana    Family History  Problem Relation Age of Onset   Graves' disease Mother    Cancer Neg Hx    Diabetes Neg Hx    Heart failure Neg Hx    Hyperlipidemia Neg Hx     No Known Allergies  Review of Systems  All other systems reviewed and are negative.    OBJECTIVE:     Vitals:   04/30/22 1040  BP: 129/78  Pulse: 65  Temp: 98.8 F (37.1 C)  TempSrc: Oral  SpO2: 97%  Weight: 209 lb (94.8 kg)  Height: _0  (1.753 m)   Body mass index is 30.86 kg/m.  Physical Exam Constitutional:      Appearance: Normal appearance.  Pulmonary:     Effort: Pulmonary effort is normal. No respiratory distress.  Abdominal:     General: There is no distension.     Palpations: Abdomen is soft.  Skin:    General: Skin is warm and dry.  Neurological:     General: No focal deficit present.     Mental Status: She is alert and oriented to person, place, and time.  Psychiatric:        Mood and Affect: Mood normal.        Behavior: Behavior normal.      Labs and Microbiology:    Latest Ref Rng & Units 01/31/2022   11:57 AM 01/03/2022    5:11 AM 01/02/2022    5:07 AM  CBC  WBC 3.4 - 10.8 x10E3/uL 4.0  5.0  3.9   Hemoglobin 13.0 -  17.7 g/dL 12.0  8.9  9.0   Hematocrit 37.5 - 51.0 % 37.1  26.0  26.6   Platelets 150 - 450 x10E3/uL 239  149  118       Latest Ref Rng & Units 01/31/2022   11:57 AM 01/03/2022    5:11 AM 01/02/2022    5:07 AM  CMP  Glucose 70 - 99 mg/dL 87  92  102   BUN 6 - 20 mg/dL _0 Creatinine 0.76 - 1.27 mg/dL 1.07  0.76  0.99   Sodium 134 - 144 mmol/L 140  139  139   Potassium 3.5 - 5.2 mmol/L 4.4  3.7  3.7   Chloride 96 - 106 mmol/L 101  106  106   CO2 20 - 29 mmol/L _1 Calcium 8.7 - 10.2 mg/dL 9.7  8.3  8.1   Total Protein 6.0 - 8.5 g/dL 8.1     Total Bilirubin 0.0 - 1.2 mg/dL 0.5     Alkaline Phos 44 - 121 IU/L 66     AST 0 - 40 IU/L 20     ALT 0 - 44 IU/L 20        No results found for this or any previous visit (from the past 240 hour(s)).  Imaging:    ASSESSMENT & PLAN:    Human immunodeficiency virus (HIV) disease ([place]) HIV well controlled on current regimen.  Follow up with Dr Linus Salmons.  Penile discharge Patient with symptoms of penile discharge.  Will check GC/CT x 3 today and treat with CTX 532m IM and  azithromycin 1gm x 1 dose PO.  Follow up pending results from today if needed.   Syphilis Treated with Bicillin 2.4 million units x 1 dose in May 2023 with appropriate RPR titer response.  Repeat RPR today to ensure no new infection.   Orders Placed This Encounter  Procedures   [facility] 04/30/2022, 11:02 AM

## 2022-04-30 NOTE — Assessment & Plan Note (Signed)
Treated with Bicillin 2.4 million units x 1 dose in May 2023 with appropriate RPR titer response.  Repeat RPR today to ensure no new infection.

## 2022-05-01 LAB — CYTOLOGY, (ORAL, ANAL, URETHRAL) ANCILLARY ONLY
Chlamydia: NEGATIVE
Chlamydia: NEGATIVE
Comment: NEGATIVE
Comment: NEGATIVE
Comment: NORMAL
Comment: NORMAL
Neisseria Gonorrhea: NEGATIVE
Neisseria Gonorrhea: POSITIVE — AB

## 2022-05-01 LAB — URINE CYTOLOGY ANCILLARY ONLY
Chlamydia: NEGATIVE
Comment: NEGATIVE
Comment: NORMAL
Neisseria Gonorrhea: NEGATIVE

## 2022-05-02 ENCOUNTER — Ambulatory Visit: Payer: Medicaid Other | Attending: Family Medicine

## 2022-05-02 DIAGNOSIS — Z111 Encounter for screening for respiratory tuberculosis: Secondary | ICD-10-CM

## 2022-05-03 LAB — FLUORESCENT TREPONEMAL AB(FTA)-IGG-BLD: Fluorescent Treponemal ABS: REACTIVE — AB

## 2022-05-03 LAB — RPR: RPR Ser Ql: REACTIVE — AB

## 2022-05-03 LAB — RPR TITER: RPR Titer: 1:4 {titer} — ABNORMAL HIGH

## 2022-05-09 ENCOUNTER — Telehealth: Payer: Self-pay

## 2022-05-09 NOTE — Telephone Encounter (Signed)
[facility] Patient Advocate Encounter  Patient's medications have been couriered to [facility] from [facility]: [phone], and will be administered on  05/15/2022.

## 2022-05-14 ENCOUNTER — Ambulatory Visit: Payer: Self-pay | Attending: Critical Care Medicine

## 2022-05-14 DIAGNOSIS — Z111 Encounter for screening for respiratory tuberculosis: Secondary | ICD-10-CM

## 2022-05-14 DIAGNOSIS — Z23 Encounter for immunization: Secondary | ICD-10-CM | POA: Insufficient documentation

## 2022-05-14 NOTE — Progress Notes (Signed)
Tuberculin skin test  administered in Right Forearm 6 mm transient wheal noted. Patient denies pain at site. Information sheet given. Post skin test instructions given .Patient instructed to return 48-72 hour after skin  test has been administered. Voiced understanding of all instructions given.  

## 2022-05-15 ENCOUNTER — Ambulatory Visit: Payer: Medicaid Other | Admitting: Internal Medicine

## 2022-05-16 ENCOUNTER — Other Ambulatory Visit: Payer: Self-pay

## 2022-05-16 ENCOUNTER — Ambulatory Visit (INDEPENDENT_AMBULATORY_CARE_PROVIDER_SITE_OTHER): Payer: Self-pay | Admitting: Pharmacist

## 2022-05-16 DIAGNOSIS — B20 Human immunodeficiency virus [HIV] disease: Secondary | ICD-10-CM

## 2022-05-16 DIAGNOSIS — Z113 Encounter for screening for infections with a predominantly sexual mode of transmission: Secondary | ICD-10-CM

## 2022-05-16 LAB — TB SKIN TEST
Induration: 0 mm
TB Skin Test: NEGATIVE

## 2022-05-16 MED ORDER — CABOTEGRAVIR & RILPIVIRINE ER 600 & 900 MG/3ML IM SUER
1.0000 | Freq: Once | INTRAMUSCULAR | Status: AC
  Administered 2022-05-16: 1 via INTRAMUSCULAR

## 2022-05-16 NOTE — Progress Notes (Signed)
HPI: Joshua Leon. is a 26 y.o. adult who presents to the Joshua Leon clinic for Joshua Leon administration.  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   ESTRADIOL VALERATE 10 MG/ML OIL    Inject 1 mL (10 mg total) into the muscle every 14 (fourteen) days.   NEEDLES & SYRINGES MISC    1 Units by Does not apply route every 14 (fourteen) days.  Modified Medications   No medications on file  Discontinued Medications   No medications on file    Allergies: No Known Allergies  Past Medical History: Past Medical History:  Diagnosis Date   Pneumonia     Social History: Social History   Socioeconomic History   Marital status: Single    Spouse name: Not on file   Number of children: Not on file   Years of education: Not on file   Highest education level: Not on file  Occupational History   Not on file  Tobacco Use   Smoking status: Former    Types: Cigars   Smokeless tobacco: Never  Vaping Use   Vaping Use: Never used  Substance and Sexual Activity   Alcohol use: Yes    Comment: ocassionally   Drug use: Yes    Types: Marijuana   Sexual activity: Yes    Partners: Female, Male    Comment: accepted condoms  Other Topics Concern   Not on file  Social History Narrative   Not on file   Social Determinants of Health   Financial Resource Strain: Not on file   Food Insecurity: Not on file  Transportation Needs: Not on file  Physical Activity: Not on file  Stress: Not on file  Social Connections: Not on file    Labs: Lab Results  Component Value Date   HIV1RNAQUANT Not Detected 03/15/2022   HIV1RNAQUANT <20 01/01/2022   HIV1RNAQUANT Not Detected 11/16/2021   CD4TABS 817 01/01/2022   CD4TABS 581 08/31/2021    RPR and STI Lab Results  Component Value Date   LABRPR REACTIVE (A) 04/30/2022   LABRPR REACTIVE (A) 03/15/2022   LABRPR REACTIVE (A) 10/18/2021   LABRPR NON-REACTIVE 08/31/2021   LABRPR NON-REACTIVE 04/28/2021   RPRTITER 1:4 (H) 04/30/2022   RPRTITER 1:4 (H) 03/15/2022   RPRTITER 1:16 (H) 10/18/2021    STI Results GC CT  04/30/2022 11:00 AM Positive    Negative    Negative  Negative    Negative    Negative   03/15/2022  2:55 PM Negative    Negative    Negative  Negative    Negative    Negative   11/16/2021 11:40 AM Negative    Negative    Negative  Negative    Negative    Negative   10/18/2021  3:30 PM Other    Negative  Other    Negative   08/31/2021  9:07 AM Negative    Positive  Negative    Negative   04/28/2021 11:01 AM Negative    Negative  Negative    Negative     Hepatitis B Lab Results  Component Value Date   HEPBSAB NON-REACTIVE 04/28/2021   HEPBSAG NON-REACTIVE 04/28/2021   HEPBCAB NON-REACTIVE 04/28/2021   Hepatitis C Lab Results  Component Value Date   HEPCAB NON-REACTIVE 04/28/2021   Hepatitis A Lab Results  Component Value Date   HAV REACTIVE (A) 04/28/2021   Lipids: Lab Results  Component Value Date   CHOL 207 (H) 04/28/2021   TRIG 130 04/28/2021   HDL 62 04/28/2021   CHOLHDL 3.3 04/28/2021   LDLCALC 120 (H) 04/28/2021    TARGET DATE:  The 24th of the month  Current HIV Regimen: Cabenuva  Assessment: Joshua Leon presents today for their maintenance Cabenuva injections. Initial/past injections were tolerated well without issues. No problems with systemic effects of  injections. She missed her appointment with Joshua Leon yesterday because she was running late.   Administered cabotegravir 663m/3mL in left upper outer quadrant of the gluteal muscle. Administered rilpivirine 900 mg/365min the right upper outer quadrant of the gluteal muscle. Monitored patient for 10 minutes after injection. Injections were tolerated well without issue. Patient will follow up in 2 months for next injection. Will defer HIV RNA to next visit; she will also need HBV sAb recheck at her next visit after completing HBV vaccine series earlier this year.  DaRatates her penile discharge has improved since her visit with Joshua Leon in December. Will retest 3-site screening per her request. She tested positive for gonorrhea which was treated with ceftriaxone; she also received azithromycin.   Plan: - Cabenuva injections administered - Check urine/oral/rectal cytologies - Next injections scheduled for 2/27 with Joshua Leon 4/17 with me  - Call with any issues or questions  Joshua Leon, CPP, BCIDP, AALakeviewlinical Pharmacist Practitioner [place]

## 2022-05-17 LAB — URINE CYTOLOGY ANCILLARY ONLY
Chlamydia: NEGATIVE
Comment: NEGATIVE
Comment: NORMAL
Neisseria Gonorrhea: NEGATIVE

## 2022-05-17 LAB — CYTOLOGY, (ORAL, ANAL, URETHRAL) ANCILLARY ONLY
Chlamydia: NEGATIVE
Chlamydia: NEGATIVE
Comment: NEGATIVE
Comment: NEGATIVE
Comment: NORMAL
Comment: NORMAL
Neisseria Gonorrhea: NEGATIVE
Neisseria Gonorrhea: NEGATIVE

## 2022-05-17 NOTE — Progress Notes (Signed)
Let pt know TB test was negative

## 2022-05-18 ENCOUNTER — Telehealth: Payer: Self-pay

## 2022-05-18 NOTE — Telephone Encounter (Signed)
-----   Message from Storm Frisk, MD sent at 05/17/2022  4:46 AM EST ----- Let pt know TB test was negative

## 2022-05-18 NOTE — Telephone Encounter (Signed)
Pt was called and is aware of results, DOB was confirmed.  ?

## 2022-06-04 NOTE — Progress Notes (Unsigned)
New Patient Office Visit  Subjective    Patient ID: Joshua Brigham., adult    DOB: January 01, 1996  Age: 27 y.o. MRN: [id number]  CC:  No chief complaint on file.   HPI 01/31/22  Joshua Payor. presents to establish care This is a 27 year old male gender identified at birth now transitioning to male.  Patient has history of HIV diagnosed in December 2022.  Followed closely by the HIV clinic is on every 2 months injectable therapy.  Patient was just hospitalized August with COVID infection manifested by severe gastrointestinal inflammation and incessant nausea and vomiting with Mallory-Weiss tear and upper GI bleed with anemia.  Patient needs follow-up as an outpatient with gastroenterology this is yet to be achieved.  Patient does not smoke tobacco products but does use marijuana.  Her CD4 counts have been good on therapy.  She was starting now estrogen therapy to transition to male gender identification.  The management of the estrogen therapy has been under HIV infectious disease clinic.  On arrival blood pressure 123/70 and below is documentation from the discharge summary Admit date:     01/01/2022  Discharge date: 01/03/22  Discharge Physician: Jerald Kief A Regalado   Recommendations at discharge:    Follow up with ID for further care HIV.      Discharge Diagnoses: Principal Problem:   GIB (gastrointestinal bleeding) Active Problems:   Mallory-Weiss tear   Human immunodeficiency virus (HIV) disease ([place])   COVID-19 virus infection   Resolved Problems:   * No resolved hospital problems. *   Hospital Course: 27 year old with past medical history significant for HIV presents complaining of dark stool, nausea and vomiting.  She states that the morning of admission on her way to work she developed nausea followed by episode of vomiting with a small amount of blood.  Patient was found to be positive for COVID.  Chest x-ray no acute abnormality. He was evaluated by GI and  underwent endoscopy on 80/80/2023.  10 mm nonbleeding Mallory-Weiss tear noted treated with 4 clips.  2 cm hiatal hernia also noted.  Patient was a started on PPI for 1 months per GI recommendation. Started on Paxlovid for COVID 19 virus infection.  Appears to be asymptomatic from Respiratory symptoms currently.   Assessment and Plan: 1-GI bleed, secondary to Mallory-Weiss tear Presented with nausea vomiting, hematemesis.  Hemoglobin on admission at 12 from a baseline of 16.  Subsequently hemoglobin has remained stable around 9. He was started on PPI, will provide refill for 1 month. He underwent endoscopy 80/80/2023.  10 mm nonbleeding Mallory-Weiss tear noted treated with 4 clips.   2-COVID-19 virus infection: Screening positive on admission.  GI symptoms may be associated.  He does not have any respiratory symptoms.  He was started on Paxlovid.  He will complete treatment.   3-human immunodeficiency virus Thank you 2 months Cabenuva.  Last CD4 581.  Continue to follow-up with Dr. Everlene Balls           Consultants: Dr Benson Norway  Procedures performed: Endoscopy  Below is documentation for the last infectious disease visit ID OV [id number] Doing well on Cabenuva and dose given today.  No changes and has follow up in October scheduled.  Labs done earlier this month.   I discussed hormone therapy and will start with 10 mg of estradiol IM and will schedule a nurse visit.  Discussed it will be every 2 weeks.  Follow up in 2 months.  Can consider a dose increase  and spironalactone, as appropriate.    The patient has no other complaints  06/05/22  never saw GI   GIB (gastrointestinal bleeding)       No further GI bleeding we will assess complete blood counts at this visit and refer back to gastroenterology for further management and follow-up        Relevant Orders    CBC with Differential/Platelet    Ambulatory referral to Gastroenterology    Mallory-Weiss tear - Primary      As per  gastrointestinal bleeding assessment        Relevant Orders    CBC with Differential/Platelet    Ambulatory referral to Gastroenterology        Other    Human immunodeficiency virus (HIV) disease ([facility])      Patient tolerating every 74-month injectable therapy we will continue same per infectious disease        COVID-19 virus infection    Relevant Orders    Comprehensive metabolic panel    Gender identity disorder, unspecified      The patient identifies as male wishing to transition gender to male status and has estrogen therapy which is to be started today in the infectious disease clinic                 Outpatient Encounter Medications as of 06/05/2022  Medication Sig   cabotegravir & rilpivirine ER (CABENUVA) 600 & 900 MG/3ML injection Inject 1 kit into the muscle every 2 (two) months.   Estradiol Valerate 10 MG/ML OIL Inject 1 mL (10 mg total) into the muscle every 14 (fourteen) days.   Needles & Syringes MISC 1 Units by Does not apply route every 14 (fourteen) days.   No facility-administered encounter medications on file as of 06/05/2022.    Past Medical History:  Diagnosis Date   Pneumonia     Past Surgical History:  Procedure Laterality Date   ESOPHAGOGASTRODUODENOSCOPY (EGD) WITH PROPOFOL N/A 01/02/2022   Procedure: ESOPHAGOGASTRODUODENOSCOPY (EGD) WITH PROPOFOL;  Surgeon: Jeani Hawking, MD;  Location: [facility];  Service: Gastroenterology;  Laterality: N/A;  Hematememesis, anemia, blood in stool.I   HEMOSTASIS CLIP PLACEMENT  01/02/2022   Procedure: HEMOSTASIS CLIP PLACEMENT;  Surgeon: Jeani Hawking, MD;  Location: [facility];  Service: Gastroenterology;;    Family History  Problem Relation Age of Onset   Graves' disease Mother    Cancer Neg Hx    Diabetes Neg Hx    Heart failure Neg Hx    Hyperlipidemia Neg Hx     Social History   Socioeconomic History   Marital status: Single    Spouse name: Not on file   Number of children: Not on file    Years of education: Not on file   Highest education level: Not on file  Occupational History   Not on file  Tobacco Use   Smoking status: Former    Types: Cigars   Smokeless tobacco: Never  Vaping Use   Vaping Use: Never used  Substance and Sexual Activity   Alcohol use: Yes    Comment: ocassionally   Drug use: Yes    Types: Marijuana   Sexual activity: Yes    Partners: Female, Male    Comment: accepted condoms  Other Topics Concern   Not on file  Social History Narrative   Not on file   Social Determinants of Health   Financial Resource Strain: Not on file  Food Insecurity: Not on file  Transportation Needs: Not on file  Physical Activity: Not on file  Stress: Not on file  Social Connections: Not on file  Intimate Partner Violence: Not on file    Review of Systems  Constitutional:  Negative for chills, diaphoresis, fever, malaise/fatigue and weight loss.  HENT:  Negative for congestion, hearing loss, nosebleeds, sore throat and tinnitus.   Eyes:  Negative for blurred vision, photophobia and redness.  Respiratory:  Negative for cough, hemoptysis, sputum production, shortness of breath, wheezing and stridor.   Cardiovascular:  Negative for chest pain, palpitations, orthopnea, claudication, leg swelling and PND.  Gastrointestinal:  Negative for abdominal pain, blood in stool, constipation, diarrhea, heartburn, nausea and vomiting.  Genitourinary:  Negative for dysuria, flank pain, frequency, hematuria and urgency.  Musculoskeletal:  Negative for back pain, falls, joint pain, myalgias and neck pain.  Skin:  Negative for itching and rash.  Neurological:  Negative for dizziness, tingling, tremors, sensory change, speech change, focal weakness, seizures, loss of consciousness, weakness and headaches.  Endo/Heme/Allergies:  Negative for environmental allergies and polydipsia. Does not bruise/bleed easily.  Psychiatric/Behavioral:  Negative for depression, memory loss, substance  abuse and suicidal ideas. The patient is not nervous/anxious and does not have insomnia.         Objective    There were no vitals taken for this visit.  Physical Exam Vitals reviewed.  Constitutional:      Appearance: Normal appearance. She is well-developed. She is not diaphoretic.  HENT:     Head: Normocephalic and atraumatic.     Nose: No nasal deformity, septal deviation, mucosal edema or rhinorrhea.     Right Sinus: No maxillary sinus tenderness or frontal sinus tenderness.     Left Sinus: No maxillary sinus tenderness or frontal sinus tenderness.     Mouth/Throat:     Pharynx: No oropharyngeal exudate.  Eyes:     General: No scleral icterus.    Conjunctiva/sclera: Conjunctivae normal.     Pupils: Pupils are equal, round, and reactive to light.  Neck:     Thyroid: No thyromegaly.     Vascular: No carotid bruit or JVD.     Trachea: Trachea normal. No tracheal tenderness or tracheal deviation.  Cardiovascular:     Rate and Rhythm: Normal rate and regular rhythm.     Chest Wall: PMI is not displaced.     Pulses: Normal pulses. No decreased pulses.     Heart sounds: Normal heart sounds, S1 normal and S2 normal. Heart sounds not distant. No murmur heard.    No systolic murmur is present.     No diastolic murmur is present.     No friction rub. No gallop. No S3 or S4 sounds.  Pulmonary:     Effort: No tachypnea, accessory muscle usage or respiratory distress.     Breath sounds: No stridor. No decreased breath sounds, wheezing, rhonchi or rales.  Chest:     Chest wall: No tenderness.  Abdominal:     General: Bowel sounds are normal. There is no distension.     Palpations: Abdomen is soft. Abdomen is not rigid.     Tenderness: There is no abdominal tenderness. There is no guarding or rebound.  Musculoskeletal:        General: Normal range of motion.     Cervical back: Normal range of motion and neck supple. No edema, erythema or rigidity. No muscular tenderness. Normal  range of motion.  Lymphadenopathy:     Head:     Right side of head: No submental or submandibular  adenopathy.     Left side of head: No submental or submandibular adenopathy.     Cervical: No cervical adenopathy.  Skin:    General: Skin is warm and dry.     Coloration: Skin is not pale.     Findings: No rash.     Nails: There is no clubbing.  Neurological:     Mental Status: She is alert and oriented to person, place, and time.     Sensory: No sensory deficit.  Psychiatric:        Speech: Speech normal.        Behavior: Behavior normal.         Assessment & Plan:   Problem List Items Addressed This Visit   None  No follow-ups on file.   Shan Levans, MD

## 2022-06-05 ENCOUNTER — Telehealth: Payer: Self-pay

## 2022-06-05 ENCOUNTER — Encounter: Payer: Self-pay | Admitting: Critical Care Medicine

## 2022-06-05 ENCOUNTER — Ambulatory Visit: Payer: Medicaid Other | Admitting: Pharmacist

## 2022-06-05 ENCOUNTER — Ambulatory Visit: Payer: Self-pay | Attending: Critical Care Medicine | Admitting: Critical Care Medicine

## 2022-06-05 VITALS — BP 135/72 | HR 68 | Ht 69.0 in | Wt 202.8 lb

## 2022-06-05 DIAGNOSIS — Z8719 Personal history of other diseases of the digestive system: Secondary | ICD-10-CM | POA: Insufficient documentation

## 2022-06-05 DIAGNOSIS — K029 Dental caries, unspecified: Secondary | ICD-10-CM | POA: Insufficient documentation

## 2022-06-05 DIAGNOSIS — Z113 Encounter for screening for infections with a predominantly sexual mode of transmission: Secondary | ICD-10-CM | POA: Insufficient documentation

## 2022-06-05 DIAGNOSIS — X58XXXA Exposure to other specified factors, initial encounter: Secondary | ICD-10-CM | POA: Insufficient documentation

## 2022-06-05 DIAGNOSIS — K226 Gastro-esophageal laceration-hemorrhage syndrome: Secondary | ICD-10-CM | POA: Insufficient documentation

## 2022-06-05 DIAGNOSIS — Z8616 Personal history of COVID-19: Secondary | ICD-10-CM | POA: Insufficient documentation

## 2022-06-05 DIAGNOSIS — Z21 Asymptomatic human immunodeficiency virus [HIV] infection status: Secondary | ICD-10-CM | POA: Insufficient documentation

## 2022-06-05 DIAGNOSIS — S025XXB Fracture of tooth (traumatic), initial encounter for open fracture: Secondary | ICD-10-CM | POA: Insufficient documentation

## 2022-06-05 DIAGNOSIS — K922 Gastrointestinal hemorrhage, unspecified: Secondary | ICD-10-CM

## 2022-06-05 DIAGNOSIS — B20 Human immunodeficiency virus [HIV] disease: Secondary | ICD-10-CM

## 2022-06-05 HISTORY — DX: Fracture of tooth (traumatic), initial encounter for open fracture: S02.5XXB

## 2022-06-05 NOTE — Assessment & Plan Note (Signed)
No active bleeding we will monitor

## 2022-06-05 NOTE — Telephone Encounter (Signed)
Received a call from Dr. Asencion Noble upset that this pt has not been seen. Pt was scoped in the hospital by Dr. Benson Norway in August for a [facility] tear. She is still having esophageal pain and dysphagia. He referred pt to Dr. Benson Norway and they refuse to see her as she has Medicaid. He referred the pt here and since she had previous GI hx with [facility] the referral was declined per Dr. Joya Gaskins. He states the pt has to be seen and wanted this relayed to the Medical Director. States if we will not see her he will refer her to [facility]. Reports he knows all the politics. Dr. Hilarie Fredrickson please advise as medical director. He said to feel free to message him if you need to.

## 2022-06-05 NOTE — Telephone Encounter (Signed)
Sophia please see note below from Dr. Hilarie Fredrickson. Pt can be scheduled for esophageal pain/dysphagia.

## 2022-06-05 NOTE — Assessment & Plan Note (Signed)
Screen for STD

## 2022-06-05 NOTE — Patient Instructions (Signed)
We are working on getting you a gastroenterology appointment  Referral to dentistry will be made urgently  Urine and blood samples today for sexual transmitted disease screening  Return Dr Joya Gaskins 5 months  Keep infectious disease follow up

## 2022-06-05 NOTE — Assessment & Plan Note (Signed)
History of Mallory-Weiss tear needs follow-up endoscopic exam.  Need to find a gastroenterology practice that we will take her with Medicaid.  May have to outsource out of [place] as to practices have declined to see her.  I did call [facility] today to see if they would reconsider seeing her in follow-up

## 2022-06-05 NOTE — Telephone Encounter (Signed)
I do not think there is anything clinical at play here; not sure of this implication Certainly okay for patient to be seen here Can be scheduled with next available APP or physician

## 2022-06-05 NOTE — Assessment & Plan Note (Signed)
Fractured tooth with dental caries will refer patient to practice that takes Medicaid

## 2022-06-05 NOTE — Assessment & Plan Note (Signed)
Care per infectious disease

## 2022-06-06 LAB — HCV INTERPRETATION

## 2022-06-06 LAB — HCV AB W REFLEX TO QUANT PCR: HCV Ab: NONREACTIVE

## 2022-06-06 LAB — HSV-2 AB, IGG: HSV 2 IgG, Type Spec: <0.91 index (ref 0.00–0.90)

## 2022-06-07 ENCOUNTER — Ambulatory Visit (INDEPENDENT_AMBULATORY_CARE_PROVIDER_SITE_OTHER): Payer: Self-pay | Admitting: Pharmacist

## 2022-06-07 ENCOUNTER — Other Ambulatory Visit: Payer: Self-pay

## 2022-06-07 DIAGNOSIS — Z113 Encounter for screening for infections with a predominantly sexual mode of transmission: Secondary | ICD-10-CM

## 2022-06-07 DIAGNOSIS — A539 Syphilis, unspecified: Secondary | ICD-10-CM

## 2022-06-07 DIAGNOSIS — Z202 Contact with and (suspected) exposure to infections with a predominantly sexual mode of transmission: Secondary | ICD-10-CM

## 2022-06-07 MED ORDER — CEFTRIAXONE SODIUM 500 MG IJ SOLR
500.0000 mg | Freq: Once | INTRAMUSCULAR | Status: AC
  Administered 2022-06-07: 500 mg via INTRAMUSCULAR

## 2022-06-07 MED ORDER — PENICILLIN G BENZATHINE 1200000 UNIT/2ML IM SUSY
1.2000 10*6.[IU] | PREFILLED_SYRINGE | Freq: Once | INTRAMUSCULAR | Status: AC
  Administered 2022-06-07: 1.2 10*6.[IU] via INTRAMUSCULAR

## 2022-06-07 NOTE — Progress Notes (Signed)
06/07/2022  HPI: Joshua Leon. is a 27 y.o. adult who presents to the [facility] clinic today for STI testing.  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   ESTRADIOL VALERATE 10 MG/ML OIL    Inject 1 mL (10 mg total) into the muscle every 14 (fourteen) days.   NEEDLES & SYRINGES MISC    1 Units by Does not apply route every 14 (fourteen) days.  Modified Medications   No medications on file  Discontinued Medications   No medications on file    Allergies: No Known Allergies  Past Medical History: Past Medical History:  Diagnosis Date   COVID-19 virus infection 01/01/2022   Pneumonia     Social History: Social History   Socioeconomic History   Marital status: Single    Spouse name: Not on file   Number of children: Not on file   Years of education: Not on file   Highest education level: Not on file  Occupational History   Not on file  Tobacco Use   Smoking status: Former    Types: Cigars   Smokeless tobacco: Never  Vaping Use   Vaping Use: Never used  Substance and Sexual Activity   Alcohol use: Yes    Comment: ocassionally   Drug use: Yes    Types: Marijuana   Sexual activity: Yes    Partners: Female, Male    Comment: accepted condoms  Other Topics Concern   Not on file  Social History Narrative    Not on file   Social Determinants of Health   Financial Resource Strain: Not on file  Food Insecurity: Not on file  Transportation Needs: Not on file  Physical Activity: Not on file  Stress: Not on file  Social Connections: Not on file     Assessment: Zyler is here for STD testing today. She states that she "knows her body" and feels like she has been exposed to something, although she is not sure what. She has had new partners since she was last tested where the condom broke. She is versatile but mainly the receptive partner during sex. She feels like she may have the start of a small bump on her rectum. No discharge, sore throat, rash, or other symptoms. She prefers to receive treatment for everything today so will proceed with Bicillin for possible syphilis and ceftriaxone for possible gonorrhea. Will wait to see if she is positive for chlamydia before sending in doxycycline.   Discussed doxyPEP with her. She is interested. Advised to take 2 pills (200 mg) within 24-72 hours of sex, preferably within 24 hours. Will await to see if she needs chlamydia treatment before sending in the doxyPEP prescription. All questions answered. No antibiotic allergies noted. She tolerated the injections well.   Plan: - STI screening: RPR, urine/rectal/pharyngeal GC/CT swabs for cytology today - Bicillin 2.4  million units x 1 today - Ceftriaxone 500 mg IM x 1 today - F/u results to see if additional treatment is needed  Greig Altergott L. Pavneet Markwood, PharmD, BCIDP, AAHIVP, CPP Clinical Pharmacist Practitioner Infectious Diseases [facility] 06/07/2022, 10:52 AM

## 2022-06-08 LAB — CYTOLOGY, (ORAL, ANAL, URETHRAL) ANCILLARY ONLY
Chlamydia: NEGATIVE
Chlamydia: NEGATIVE
Comment: NEGATIVE
Comment: NEGATIVE
Comment: NORMAL
Comment: NORMAL
Neisseria Gonorrhea: NEGATIVE
Neisseria Gonorrhea: NEGATIVE

## 2022-06-08 LAB — URINE CYTOLOGY ANCILLARY ONLY
Chlamydia: NEGATIVE
Comment: NEGATIVE
Comment: NORMAL
Neisseria Gonorrhea: NEGATIVE

## 2022-06-09 LAB — T PALLIDUM AB: T Pallidum Abs: POSITIVE — AB

## 2022-06-09 LAB — RPR: RPR Ser Ql: REACTIVE — AB

## 2022-06-09 LAB — RPR TITER: RPR Titer: 1:4 {titer} — ABNORMAL HIGH

## 2022-06-26 ENCOUNTER — Encounter: Payer: Self-pay | Admitting: Nurse Practitioner

## 2022-06-26 ENCOUNTER — Ambulatory Visit (INDEPENDENT_AMBULATORY_CARE_PROVIDER_SITE_OTHER): Payer: Self-pay | Admitting: Nurse Practitioner

## 2022-06-26 VITALS — BP 132/76 | HR 59 | Ht 69.0 in | Wt 206.2 lb

## 2022-06-26 DIAGNOSIS — R1110 Vomiting, unspecified: Secondary | ICD-10-CM

## 2022-06-26 MED ORDER — OMEPRAZOLE 40 MG PO CPDR: 40.0000 mg | DELAYED_RELEASE_CAPSULE | Freq: Every day | ORAL | 2 refills | Status: DC

## 2022-06-26 NOTE — Progress Notes (Signed)
Assessment    Patient profile:  Joshua Leon. is a 27 y.o. year old adult male transitioning to male, new to the practice,  with a past medical history of HIV, Mallory -Weiss tear, hiatal hernia, THC use.  See PMH / Joshua Leon for additional history  # Upper GI bleed ( August 2023) secondary to Joshua Leon tear ( with adherent clot) s/p clip placement. She had been vomiting in setting of Joshua Leon. No significant N/V since August.  # ? GERD. No typical reflux symptoms but describes acidic foods getting stuck in throat and leading to regurgitation.   # HIV. On Cabenuva.    Plan:    Trial of Omeprazole 40 mg 30 minutes before breakfast She will follow up with me in  4 weeks. If not improving then she will likely need EGD.   HPI:    Chief Complaint: Follow up on esophageal tear.   Joshua Leon was hospitalized in August 2023 with N/V/hematemesis. Hgb declined to 8.9 from baseline of 16. Turns out that she had COVID 19 which was felt to be cause of the N/V.   Inpatient Joshua Leon was seen by Dr. Benson Leon, underwent EGD with findings of a 10 mm Mallory Weiss tear with an adherent clot ( see full report below). She was discharged home with on PPI x 1 month.   Joshua Leon is referred by her PCP for a hospital follow up. She has has occasional nausea without vomiting. She has an occasional twinge of pain in chest but no heartburn. However, acidic foods / red sauces feel like they get stuck in her esophagus When this happens she will usually regurgitate the acidic food. No problems swallowing any other foods, not even bread or meat.   Previous Labs / Imaging::    Latest Ref Rng & Units 01/31/2022   11:57 AM 01/03/2022    5:11 AM 01/02/2022    5:07 AM  CBC  WBC 3.4 - 10.8 x10E3/uL 4.0  5.0  3.9   Hemoglobin 13.0 - 17.7 g/dL 12.0  8.9  9.0   Hematocrit 37.5 - 51.0 % 37.1  26.0  26.6   Platelets 150 - 450 x10E3/uL 239  149  118     Lab Results  Component Value Date   LIPASE 15 01/01/2022      Latest Ref  Rng & Units 01/31/2022   11:57 AM 01/03/2022    5:11 AM 01/02/2022    5:07 AM  CMP  Glucose 70 - 99 mg/dL 87  92  102   BUN 6 - 20 mg/dL 11  9  18    Creatinine 0.76 - 1.27 mg/dL 1.07  0.76  0.99   Sodium 134 - 144 mmol/L 140  139  139   Potassium 3.5 - 5.2 mmol/L 4.4  3.7  3.7   Chloride 96 - 106 mmol/L 101  106  106   CO2 20 - 29 mmol/L 25  26  28    Calcium 8.7 - 10.2 mg/dL 9.7  8.3  8.1   Total Protein 6.0 - 8.5 g/dL 8.1     Total Bilirubin 0.0 - 1.2 mg/dL 0.5     Alkaline Phos 44 - 121 IU/L 66     AST 0 - 40 IU/L 20     ALT 0 - 44 IU/L 20       Previous GI Evaluation   EGD Aug 2023    Past Medical History:  Diagnosis Date   COVID-19 virus infection 01/01/2022  Pneumonia    Past Surgical History:  Procedure Laterality Date   ESOPHAGOGASTRODUODENOSCOPY (EGD) WITH PROPOFOL N/A 01/02/2022   Procedure: ESOPHAGOGASTRODUODENOSCOPY (EGD) WITH PROPOFOL;  Surgeon: Joshua Ada, MD;  Location: [facility];  Service: Gastroenterology;  Laterality: N/A;  Hematememesis, anemia, blood in stool.I   HEMOSTASIS CLIP PLACEMENT  01/02/2022   Procedure: HEMOSTASIS CLIP PLACEMENT;  Surgeon: Joshua Ada, MD;  Location: [facility];  Service: Gastroenterology;;   Family History  Problem Relation Age of Onset   Graves' disease Mother    Cancer Neg Hx    Diabetes Neg Hx    Heart failure Neg Hx    Hyperlipidemia Neg Hx    Social History   Tobacco Use   Smoking status: Former    Types: Cigars   Smokeless tobacco: Never  Vaping Use   Vaping Use: Never used  Substance Use Topics   Alcohol use: Yes    Comment: ocassionally   Drug use: Yes    Types: Marijuana   Current Outpatient Medications  Medication Sig Dispense Refill   cabotegravir & rilpivirine ER (CABENUVA) 600 & 900 MG/3ML injection Inject 1 kit into the muscle every 2 (two) months. 6 mL 5   Estradiol Valerate 10 MG/ML OIL Inject 1 mL (10 mg total) into the muscle every 14 (fourteen) days. 1 mL 25   Needles & Syringes MISC 1  Units by Does not apply route every 14 (fourteen) days. 30 Units 11   No current facility-administered medications for this visit.   No Known Allergies   Review of Systems: All systems reviewed and negative except where noted in HPI.   Wt Readings from Last 3 Encounters:  06/05/22 202 lb 12.8 oz (92 kg)  04/30/22 209 lb (94.8 kg)  01/31/22 197 lb 6.4 oz (89.5 kg)    Physical Exam   BP 132/76   Pulse (!) 59   Ht 5\' 9"  (1.753 m)   Wt 206 lb 4 oz (93.6 kg)   SpO2 96%   BMI 30.46 kg/m  Constitutional:  Generally well appearing adult in no acute distress. Psychiatric: Pleasant. Normal mood and affect. Behavior is normal. EENT: Pupils normal.  Conjunctivae are normal. No scleral icterus. Neck supple.  Cardiovascular: Normal rate, regular rhythm.  Pulmonary/chest: Effort normal and breath sounds normal. No wheezing, rales or rhonchi. Abdominal: Soft, nondistended, nontender. Bowel sounds active throughout. There are no masses palpable. No hepatomegaly. Neurological: Alert and oriented to person place and time. Extremities: No edema Skin: Skin is warm and dry. No rashes noted.  Joshua Savoy, NP  06/26/2022, 7:58 AM  Cc:  Referring Provider Joshua Stain, MD

## 2022-06-26 NOTE — Patient Instructions (Addendum)
If you are age 26 or younger, your body mass index should be between 19-25. Your Body mass index is 30.46 kg/m. If this is out of the aformentioned range listed, please consider follow up with your Primary Care Provider.  ________________________________________________________  The [facility] providers would like to encourage you to use [facility] to communicate with providers for non-urgent requests or questions.  Due to long hold times on the telephone, sending your provider a message by [facility] may be a faster and more efficient way to get a response.  Please allow 48 business hours for a response.  Please remember that this is for non-urgent requests.  _______________________________________________________  We have sent the following medications to your pharmacy for you to pick up at your convenience:  START: omeprazole 40mg  one capsule daily 30 minutes prior to breakfast meal each day.  You are scheduled to follow up with Tye Savoy, NP on 07-26-22 at 3pm.  Thank you for entrusting me with your care and choosing [facility].  Tye Savoy, NP

## 2022-06-26 NOTE — Progress Notes (Signed)
Agree with the assessment and plan as outlined by Tye Savoy, NP.  Given patient's acute presentation and endoscopic finding of a linear tear at the GEJ consistent with Mack Guise tear, endoscopic confirmation of healing not necessary.  Likelihood of malignant ulcer masquerading as MWT is extremely low.  If patient is continuing to have persistent upper GI symptoms despite PPI, repeat EGD would be indicated.  Sophya Vanblarcom E. Candis Schatz, MD [facility] Gastroenter[facility]

## 2022-06-27 ENCOUNTER — Telehealth: Payer: Self-pay | Admitting: Physician Assistant

## 2022-06-27 NOTE — Telephone Encounter (Signed)
Left message to return call.  I wanted to discuss if interested in our [phone] study that will provide gender affirming hormone therapy while she is taking her current ART regimen.

## 2022-07-17 ENCOUNTER — Encounter: Payer: Medicaid Other | Admitting: Pharmacist

## 2022-07-17 ENCOUNTER — Telehealth: Payer: Self-pay

## 2022-07-17 NOTE — Telephone Encounter (Signed)
[facility] Patient Advocate Encounter  Patient's medication Kern Reap) have been couriered to [facility] from [employer] and will be administered on the patient next office visit on 07/24/22.  Ileene Patrick , [facility] Patient [facility] for Infectious Disease Phone: [phone] Fax:  [phone]

## 2022-07-24 ENCOUNTER — Ambulatory Visit (INDEPENDENT_AMBULATORY_CARE_PROVIDER_SITE_OTHER): Payer: Self-pay | Admitting: Internal Medicine

## 2022-07-24 ENCOUNTER — Other Ambulatory Visit: Payer: Self-pay

## 2022-07-24 ENCOUNTER — Encounter: Payer: Self-pay | Admitting: Internal Medicine

## 2022-07-24 VITALS — BP 116/65 | HR 58 | Temp 97.5°F | Ht 69.0 in | Wt 206.0 lb

## 2022-07-24 DIAGNOSIS — Z5181 Encounter for therapeutic drug level monitoring: Secondary | ICD-10-CM

## 2022-07-24 DIAGNOSIS — B20 Human immunodeficiency virus [HIV] disease: Secondary | ICD-10-CM

## 2022-07-24 DIAGNOSIS — Z113 Encounter for screening for infections with a predominantly sexual mode of transmission: Secondary | ICD-10-CM

## 2022-07-24 DIAGNOSIS — F649 Gender identity disorder, unspecified: Secondary | ICD-10-CM

## 2022-07-24 MED ORDER — DOXYCYCLINE HYCLATE 100 MG PO TABS: 200.0000 mg | ORAL_TABLET | Freq: Once | ORAL | 5 refills | Status: DC

## 2022-07-24 MED ORDER — CABOTEGRAVIR & RILPIVIRINE ER 600 & 900 MG/3ML IM SUER
1.0000 | Freq: Once | INTRAMUSCULAR | Status: AC
  Administered 2022-07-24: 1 via INTRAMUSCULAR

## 2022-07-24 NOTE — Progress Notes (Signed)
   Subjective:    Patient ID: Joshua Payor., adult    DOB: 20-Mar-1996, 27 y.o.   MRN: [id number]  HPI Joshua Leon is here for follow up of HIV She continues on [place] and pleased with this.  No issues with the injection site.  Continues on hormone therapy and pleased with the results.  Has recently been treated for gonorrhea and follow up testing negative.  No new concerns.     Review of Systems  Constitutional:  Negative for fatigue.  Gastrointestinal:  Negative for diarrhea and nausea.  Skin:  Negative for rash.       Objective:   Physical Exam Eyes:     General: No scleral icterus. Pulmonary:     Effort: Pulmonary effort is normal.  Skin:    Findings: No rash.  Neurological:     Mental Status: She is alert.   SH: no current tobacco        Assessment & Plan:

## 2022-07-24 NOTE — Assessment & Plan Note (Signed)
Will check cbc, cm[p

## 2022-07-24 NOTE — Assessment & Plan Note (Signed)
Will screen Also discussed doxypep and provided.

## 2022-07-24 NOTE — Assessment & Plan Note (Signed)
Doing well on hormone treatments and will continue

## 2022-07-24 NOTE — Assessment & Plan Note (Signed)
No concerns today.  Doing well on Cabenuva and will check labs today. Otherwise will follow up in 2 months with Pharmacy

## 2022-07-25 LAB — CYTOLOGY, (ORAL, ANAL, URETHRAL) ANCILLARY ONLY
Chlamydia: NEGATIVE
Chlamydia: NEGATIVE
Comment: NEGATIVE
Comment: NEGATIVE
Comment: NORMAL
Comment: NORMAL
Neisseria Gonorrhea: NEGATIVE
Neisseria Gonorrhea: NEGATIVE

## 2022-07-25 LAB — T-HELPER CELL (CD4) - (RCID CLINIC ONLY)
CD4 % Helper T Cell: 32 % — ABNORMAL LOW (ref 33–65)
CD4 T Cell Abs: 463 /uL (ref 400–1790)

## 2022-07-26 ENCOUNTER — Ambulatory Visit: Payer: Self-pay | Admitting: Nurse Practitioner

## 2022-07-27 LAB — LIPID PANEL
Cholesterol: 182 mg/dL (ref <200)
HDL: 64 mg/dL (ref >=40)
LDL Cholesterol (Calc): 103 mg/dL (calc) — ABNORMAL HIGH
Non-HDL Cholesterol (Calc): 118 mg/dL (calc) (ref <130)
Total CHOL/HDL Ratio: 2.8 (calc) (ref <5.0)
Triglycerides: 67 mg/dL (ref <150)

## 2022-07-27 LAB — CBC WITH DIFFERENTIAL/PLATELET
Absolute Monocytes: 257 cells/uL (ref 200–950)
Basophils Absolute: 31 cells/uL (ref 0–200)
Basophils Relative: 1 %
Eosinophils Absolute: 90 cells/uL (ref 15–500)
Eosinophils Relative: 2.9 %
HCT: 43.6 % (ref 38.5–50.0)
Hemoglobin: 14.2 g/dL (ref 13.2–17.1)
Lymphs Abs: 1556 cells/uL (ref 850–3900)
MCH: 26.8 pg — ABNORMAL LOW (ref 27.0–33.0)
MCHC: 32.6 g/dL (ref 32.0–36.0)
MCV: 82.3 fL (ref 80.0–100.0)
MPV: 10.1 fL (ref 7.5–12.5)
Monocytes Relative: 8.3 %
Neutro Abs: 1166 cells/uL — ABNORMAL LOW (ref 1500–7800)
Neutrophils Relative %: 37.6 %
Platelets: 214 10*3/uL (ref 140–400)
RBC: 5.3 10*6/uL (ref 4.20–5.80)
RDW: 16.4 % — ABNORMAL HIGH (ref 11.0–15.0)
Total Lymphocyte: 50.2 %
WBC: 3.1 10*3/uL — ABNORMAL LOW (ref 3.8–10.8)

## 2022-07-27 LAB — COMPLETE METABOLIC PANEL WITH GFR
AG Ratio: 1.5 (calc) (ref 1.0–2.5)
ALT: 27 U/L (ref 9–46)
AST: 29 U/L (ref 10–40)
Albumin: 4.6 g/dL (ref 3.6–5.1)
Alkaline phosphatase (APISO): 67 U/L (ref 36–130)
BUN: 11 mg/dL (ref 7–25)
CO2: 29 mmol/L (ref 20–32)
Calcium: 9.3 mg/dL (ref 8.6–10.3)
Chloride: 102 mmol/L (ref 98–110)
Creat: 0.9 mg/dL (ref 0.60–1.24)
Globulin: 3.1 g/dL (calc) (ref 1.9–3.7)
Glucose, Bld: 95 mg/dL (ref 65–99)
Potassium: 4.2 mmol/L (ref 3.5–5.3)
Sodium: 139 mmol/L (ref 135–146)
Total Bilirubin: 0.4 mg/dL (ref 0.2–1.2)
Total Protein: 7.7 g/dL (ref 6.1–8.1)
eGFR: 122 mL/min/{1.73_m2} (ref >=60)

## 2022-07-27 LAB — HEPATITIS B SURFACE ANTIBODY,QUALITATIVE: Hep B S Ab: REACTIVE — AB

## 2022-07-27 LAB — HIV-1 RNA QUANT-NO REFLEX-BLD
HIV 1 RNA Quant: NOT DETECTED Copies/mL
HIV-1 RNA Quant, Log: NOT DETECTED Log cps/mL

## 2022-07-27 LAB — T PALLIDUM AB: T Pallidum Abs: POSITIVE — AB

## 2022-07-27 LAB — RPR TITER: RPR Titer: 1:4 {titer} — ABNORMAL HIGH

## 2022-07-27 LAB — RPR: RPR Ser Ql: REACTIVE — AB

## 2022-08-15 ENCOUNTER — Ambulatory Visit: Payer: Medicaid Other | Admitting: Pharmacist

## 2022-08-21 ENCOUNTER — Other Ambulatory Visit: Payer: Self-pay

## 2022-08-21 ENCOUNTER — Ambulatory Visit (INDEPENDENT_AMBULATORY_CARE_PROVIDER_SITE_OTHER): Payer: Self-pay | Admitting: Pharmacist

## 2022-08-21 DIAGNOSIS — Z113 Encounter for screening for infections with a predominantly sexual mode of transmission: Secondary | ICD-10-CM

## 2022-08-21 DIAGNOSIS — Z23 Encounter for immunization: Secondary | ICD-10-CM

## 2022-08-21 DIAGNOSIS — A549 Gonococcal infection, unspecified: Secondary | ICD-10-CM

## 2022-08-21 DIAGNOSIS — A539 Syphilis, unspecified: Secondary | ICD-10-CM

## 2022-08-21 MED ORDER — CEFTRIAXONE SODIUM 500 MG IJ SOLR
500.0000 mg | Freq: Once | INTRAMUSCULAR | Status: AC
  Administered 2022-08-21: 500 mg via INTRAMUSCULAR

## 2022-08-21 MED ORDER — PENICILLIN G BENZATHINE 1200000 UNIT/2ML IM SUSY
2.4000 10*6.[IU] | PREFILLED_SYRINGE | Freq: Once | INTRAMUSCULAR | Status: AC
  Administered 2022-08-21: 2.4 10*6.[IU] via INTRAMUSCULAR

## 2022-08-21 NOTE — Progress Notes (Signed)
08/21/2022  HPI: Joshua Leon. is a 27 y.o. adult who presents to the [facility] clinic today for STI testing.  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   ESTRADIOL VALERATE 10 MG/ML OIL    Inject 1 mL (10 mg total) into the muscle every 14 (fourteen) days.   MULTIPLE VITAMINS-MINERALS (HAIR VITAMINS) TABS    Take 1 tablet by mouth daily at 2 PM.   NEEDLES & SYRINGES MISC    1 Units by Does not apply route every 14 (fourteen) days.   OMEPRAZOLE (PRILOSEC) 40 MG CAPSULE    Take 1 capsule (40 mg total) by mouth daily. Take 1 capsule 30 minutes prior to breakfast meal each day.  Modified Medications   No medications on file  Discontinued Medications   No medications on file    Allergies: No Known Allergies  Past Medical History: Past Medical History:  Diagnosis Date   COVID-19 virus infection 01/01/2022   Mallory-Weiss tear    Pneumonia     Social History: Social History   Socioeconomic History   Marital status: Single    Spouse name: Not on file   Number of children: Not on file   Years of education: Not on file   Highest education level: Not on file  Occupational History   Not on file  Tobacco Use   Smoking status: Former    Types: Cigars   Smokeless tobacco: Never  Vaping Use   Vaping Use: Never used   Substance and Sexual Activity   Alcohol use: Yes    Comment: ocassionally   Drug use: Yes    Frequency: 7.0 times per week    Types: Marijuana   Sexual activity: Yes    Partners: Female, Male    Comment: accepted condoms  Other Topics Concern   Not on file  Social History Narrative   Not on file   Social Determinants of Health   Financial Resource Strain: Not on file  Food Insecurity: Not on file  Transportation Needs: Not on file  Physical Activity: Not on file  Stress: Not on file  Social Connections: Not on file    Assessment: [name] presents today for routine STI testing. Will get full STI testing with RPR, oral/urine/rectal cytologies. Lab mentioned that a urine sample was accidentally not obtained when seeing the patient today. We will defer for now and follow-up with the patient to make her aware of the error. She mentions having white discharge recently and wishes to receive empiric treatment for gonorrhea and syphilis today with ceftriaxone and Bicillin. We will defer treatment for chlamydia for now and send in a doxycycline prescription if STI results are positive for chlamydia. The patient is interested in starting doxy-PEP. She was counseled to take doxycycline 200mg  within 24-72 hours after unprotected sex. She also knows to take doxycycline with food  in an attempt to reduce GI upset. If the patient is negative for chlamydia, we will send in a doxy-PEP prescription for her.   The patient is eligible for several vaccines, including PCV20, covid, and Mpox. She requests PCV20 today given her history of pneumonia (last instance ~3 years ago per patient). She wishes to further research the Mpox vaccine, so we will defer this for now. We will defer the covid vaccine today as well as the patient is receiving multiple needle-sticks during today's appointment.   The patient reports a tooth ache x 1 week that she has been treating with Advil. She reports needing to get in contact with  the dentist located in [facility]. I gave her their phone number and she plans to call today for an appointment.   Plan: - STI screening: RPR, rectal/pharyngeal GC/CT swabs for cytology today - Call patient to explain no urine sample for STI screening  - Empirically treat gonorrhea with ceftriaxone 500mg  IM x1 - Empirically treat syphilis with Bicillin 2.4 million units x1  - F/u results to see if treatment is needed for chlamydia  - Send in doxy-PEP prescription if chlamydia (-)  - Next follow-up appointment scheduled for 09/12/22 with Samule Ohm, PharmD PGY1 Pharmacy Resident 3/26/20249:55 AM

## 2022-08-22 LAB — CYTOLOGY, (ORAL, ANAL, URETHRAL) ANCILLARY ONLY
Chlamydia: NEGATIVE
Chlamydia: NEGATIVE
Comment: NEGATIVE
Comment: NORMAL
Comment: NEGATIVE
Comment: NORMAL
Neisseria Gonorrhea: NEGATIVE
Neisseria Gonorrhea: NEGATIVE

## 2022-08-23 LAB — RPR: RPR Ser Ql: REACTIVE — AB

## 2022-08-23 LAB — RPR TITER: RPR Titer: 1:4 {titer} — ABNORMAL HIGH

## 2022-08-23 LAB — T PALLIDUM AB: T Pallidum Abs: POSITIVE — AB

## 2022-09-06 ENCOUNTER — Telehealth: Payer: Self-pay

## 2022-09-06 ENCOUNTER — Ambulatory Visit (INDEPENDENT_AMBULATORY_CARE_PROVIDER_SITE_OTHER): Payer: Self-pay | Admitting: Pharmacist

## 2022-09-06 ENCOUNTER — Other Ambulatory Visit: Payer: Self-pay

## 2022-09-06 DIAGNOSIS — Z113 Encounter for screening for infections with a predominantly sexual mode of transmission: Secondary | ICD-10-CM

## 2022-09-06 NOTE — Telephone Encounter (Signed)
[facility] Patient Advocate Encounter  Patient's medication (Cabenuva) have been couriered to [facility] from [facility] and will be administered on the patient next office visit on 09/12/22.  [name] , CPhT Specialty Pharmacy Patient [facility] Phone: [phone] Fax:  [phone]  

## 2022-09-06 NOTE — Progress Notes (Signed)
09/06/2022  HPI: Joshua Leon. is a 27 y.o. adult who presents to the [facility] clinic today for STI testing.  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   ESTRADIOL VALERATE 10 MG/ML OIL    Inject 1 mL (10 mg total) into the muscle every 14 (fourteen) days.   MULTIPLE VITAMINS-MINERALS (HAIR VITAMINS) TABS    Take 1 tablet by mouth daily at 2 PM.   NEEDLES & SYRINGES MISC    1 Units by Does not apply route every 14 (fourteen) days.   OMEPRAZOLE (PRILOSEC) 40 MG CAPSULE    Take 1 capsule (40 mg total) by mouth daily. Take 1 capsule 30 minutes prior to breakfast meal each day.  Modified Medications   No medications on file  Discontinued Medications   No medications on file    Allergies: No Known Allergies  Past Medical History: Past Medical History:  Diagnosis Date   COVID-19 virus infection 01/01/2022   Mallory-Weiss tear    Pneumonia     Social History: Social History   Socioeconomic History   Marital status: Single    Spouse name: Not on file   Number of children: Not on file   Years of education: Not on file   Highest education level: Not on file  Occupational History   Not on file  Tobacco Use   Smoking status: Former    Types: Cigars   Smokeless tobacco: Never  Vaping Use   Vaping Use: Never used  Substance  and Sexual Activity   Alcohol use: Yes    Comment: ocassionally   Drug use: Yes    Frequency: 7.0 times per week    Types: Marijuana   Sexual activity: Yes    Partners: Female, Male    Comment: accepted condoms  Other Topics Concern   Not on file  Social History Narrative   Not on file   Social Determinants of Health   Financial Resource Strain: Not on file  Food Insecurity: Not on file  Transportation Needs: Not on file  Physical Activity: Not on file  Stress: Not on file  Social Connections: Not on file     Assessment: [name] presents today for STI testing. She has had no new partners but states that her and her current partner are not exclusive. She states that she has had white discharge and the urge to urinate for the last couple of weeks. She was recently tested for STIs at the end of March and empirically treated with bicillin and ceftriaxone. Her rectal and oral swabs both came back negative at that time. Her RPR titer was consistent with previous measurements at 1:4; not indicative of reinfection. She did not complete a urine cytology at the end of March due to a lab miscommunication.   Will plan to recheck oral, rectal, and urine cytologies today as well as  an RPR titer today. I explained to [name] that if these results are not indicative of an STI that she would need to follow up with her PCP. She stated that she is established with a PCP upstairs here at the [facility].   Her preference is to be empirically retreated again, but I explained that we would like to wait to see a positive result before treating again. I stated that I would call her to inform her of the results which should be back in a few days.   She also reports that she followed up with a dentist about her toothache and recently completed a course of amoxicillin. She will need to follow up again to have the offending tooth removed. Will connect her with the dentist again when I notify her of her  test results.   Plan: - STI screening: RPR, urine/rectal/pharyngeal GC/CT swabs for cytology today - Follow results to see if treatment is needed  Blane Ohara, PharmD  PGY1 Pharmacy Resident

## 2022-09-11 LAB — URINE CYTOLOGY ANCILLARY ONLY
Chlamydia: NEGATIVE
Comment: NEGATIVE
Comment: NORMAL
Neisseria Gonorrhea: NEGATIVE

## 2022-09-11 LAB — CYTOLOGY, (ORAL, ANAL, URETHRAL) ANCILLARY ONLY
Chlamydia: NEGATIVE
Chlamydia: NEGATIVE
Comment: NEGATIVE
Comment: NEGATIVE
Comment: NORMAL
Comment: NORMAL
Neisseria Gonorrhea: NEGATIVE
Neisseria Gonorrhea: NEGATIVE

## 2022-09-11 LAB — RPR: RPR Ser Ql: REACTIVE — AB

## 2022-09-11 LAB — T PALLIDUM AB: T Pallidum Abs: POSITIVE — AB

## 2022-09-11 LAB — RPR TITER: RPR Titer: 1:1 {titer} — ABNORMAL HIGH

## 2022-09-12 ENCOUNTER — Ambulatory Visit (INDEPENDENT_AMBULATORY_CARE_PROVIDER_SITE_OTHER): Payer: Self-pay | Admitting: Pharmacist

## 2022-09-12 ENCOUNTER — Other Ambulatory Visit: Payer: Self-pay

## 2022-09-12 DIAGNOSIS — Z23 Encounter for immunization: Secondary | ICD-10-CM

## 2022-09-12 DIAGNOSIS — Z113 Encounter for screening for infections with a predominantly sexual mode of transmission: Secondary | ICD-10-CM

## 2022-09-12 DIAGNOSIS — B20 Human immunodeficiency virus [HIV] disease: Secondary | ICD-10-CM

## 2022-09-12 MED ORDER — CABOTEGRAVIR & RILPIVIRINE ER 600 & 900 MG/3ML IM SUER
1.0000 | Freq: Once | INTRAMUSCULAR | Status: AC
Start: 2022-09-12 — End: 2022-09-12
  Administered 2022-09-12: 1 via INTRAMUSCULAR

## 2022-09-12 MED ORDER — DOXYCYCLINE HYCLATE 100 MG PO TABS
ORAL_TABLET | ORAL | 2 refills | Status: DC
Start: 2022-09-12 — End: 2022-11-01

## 2022-09-12 NOTE — Progress Notes (Signed)
HPI: Joshua Leon. is a 27 y.o. adult who presents to the [facility] pharmacy clinic for [place] administration.  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   ESTRADIOL VALERATE 10 MG/ML OIL    Inject 1 mL (10 mg total) into the muscle every 14 (fourteen) days.   MULTIPLE VITAMINS-MINERALS (HAIR VITAMINS) TABS    Take 1 tablet by mouth daily at 2 PM.   NEEDLES & SYRINGES MISC    1 Units by Does not apply route every 14 (fourteen) days.   OMEPRAZOLE (PRILOSEC) 40 MG CAPSULE    Take 1 capsule (40 mg total) by mouth daily. Take 1 capsule 30 minutes prior to breakfast meal each day.  Modified Medications   No medications on file  Discontinued Medications   No medications on file    Allergies: No Known Allergies  Past Medical History: Past Medical History:  Diagnosis Date   COVID-19 virus infection 01/01/2022   Mallory-Weiss tear    Pneumonia     Social History: Social History   Socioeconomic History   Marital status: Single    Spouse name: Not on file   Number of children: Not on file   Years of education: Not on file   Highest education level: Not on file  Occupational History   Not on file  Tobacco Use   Smoking status: Former    Types: Cigars   Smokeless tobacco: Never  Vaping Use   Vaping Use: Never used   Substance and Sexual Activity   Alcohol use: Yes    Comment: ocassionally   Drug use: Yes    Frequency: 7.0 times per week    Types: Marijuana   Sexual activity: Yes    Partners: Female, Male    Comment: accepted condoms  Other Topics Concern   Not on file  Social History Narrative   Not on file   Social Determinants of Health   Financial Resource Strain: Not on file  Food Insecurity: Not on file  Transportation Needs: Not on file  Physical Activity: Not on file  Stress: Not on file  Social Connections: Not on file    Labs: Lab Results  Component Value Date   HIV1RNAQUANT Not Detected 07/24/2022   HIV1RNAQUANT Not Detected 03/15/2022   HIV1RNAQUANT <20 01/01/2022   CD4TABS 463 07/24/2022   CD4TABS 817 01/01/2022   CD4TABS 581 08/31/2021    RPR and STI Lab Results  Component Value Date   LABRPR REACTIVE (A) 09/06/2022   LABRPR REACTIVE (A) 08/21/2022   LABRPR REACTIVE (A) 07/24/2022   LABRPR REACTIVE (A) 06/07/2022   LABRPR REACTIVE (A) 04/30/2022   RPRTITER 1:1 (H) 09/06/2022   RPRTITER 1:4 (H) 08/21/2022   RPRTITER 1:4 (H) 07/24/2022   RPRTITER 1:4 (H) 06/07/2022   RPRTITER 1:4 (H) 04/30/2022    STI Results GC CT  09/06/2022  1:46  PM Negative    Negative    Negative  Negative    Negative    Negative   08/21/2022 11:05 AM Negative  Negative   08/21/2022 10:12 AM Negative  Negative   07/24/2022 11:29 AM Negative    Negative  Negative    Negative   06/07/2022 10:54 AM Negative    Negative    Negative  Negative    Negative    Negative   05/16/2022 11:32 AM Negative    Negative  Negative    Negative   05/16/2022 11:21 AM Negative  Negative   04/30/2022 11:00 AM Positive    Negative    Negative  Negative    Negative    Negative   03/15/2022  2:55 PM Negative    Negative    Negative  Negative    Negative    Negative   11/16/2021 11:40 AM Negative    Negative    Negative  Negative    Negative    Negative   10/18/2021  3:30 PM Other     Negative  Other    Negative   08/31/2021  9:07 AM Negative    Positive  Negative    Negative   04/28/2021 11:01 AM Negative    Negative  Negative    Negative     Hepatitis B Lab Results  Component Value Date   HEPBSAB REACTIVE (A) 07/24/2022   HEPBSAG NON-REACTIVE 04/28/2021   HEPBCAB NON-REACTIVE 04/28/2021   Hepatitis C Lab Results  Component Value Date   HEPCAB NON-REACTIVE 04/28/2021   Hepatitis A Lab Results  Component Value Date   HAV REACTIVE (A) 04/28/2021   Lipids: Lab Results  Component Value Date   CHOL 182 07/24/2022   TRIG 67 07/24/2022   HDL 64 07/24/2022   CHOLHDL 2.8 07/24/2022   LDLCALC 103 (H) 07/24/2022    TARGET DATE: The 24th   Assessment: Joshua Leon presents today for her maintenance Cabenuva injections. Past injections were tolerated well without issues.  Administered cabotegravir /38mL in left upper outer quadrant of the gluteal muscle. Administered rilpivirine 900 mg/59mL in the right upper outer quadrant of the gluteal muscle. No issues with injections. She will follow up in 2 months for next set of injections.  Joshua Leon and I talked about how her STI test results from 09/06/22 were negative for any STIs. She continues to have white discharge and changes in urinary sensation. I encouraged her to visit her PCP today since they are located in the upper floor of this building. She was agreeable to this plan. Joshua Leon is eligible for the COVID vaccine and Mpox vaccine. She agrees to receive the Mpox vaccine today, but would like to defer the COVID vaccine to next time.   She stated that she still needs to have a cavity filled. I provided her with the number of the clinic dentist today and she agrees to follow up with them.   When I asked Cleve if she had been using doxy PEP, she states that she was not able to fill it. She is still interested in taking it. I highlighted the counseling points and called in a script to [facility] on [address] per her request.    Plan: - Cabenuva injections administered  - Next injections scheduled for June 25th, 2024 with Marchelle Folks  - Follow up with Dr. Luciana Axe on August 20th, 2024  - Call with any issues or questions  Blane Ohara, PharmD  PGY1 Pharmacy Resident

## 2022-11-01 ENCOUNTER — Ambulatory Visit: Payer: Self-pay | Attending: Critical Care Medicine | Admitting: Critical Care Medicine

## 2022-11-01 ENCOUNTER — Encounter: Payer: Self-pay | Admitting: Critical Care Medicine

## 2022-11-01 VITALS — BP 136/86 | HR 94 | Ht 69.0 in | Wt 204.8 lb

## 2022-11-01 DIAGNOSIS — S025XXS Fracture of tooth (traumatic), sequela: Secondary | ICD-10-CM

## 2022-11-01 DIAGNOSIS — K029 Dental caries, unspecified: Secondary | ICD-10-CM

## 2022-11-01 DIAGNOSIS — Z79899 Other long term (current) drug therapy: Secondary | ICD-10-CM | POA: Insufficient documentation

## 2022-11-01 DIAGNOSIS — K226 Gastro-esophageal laceration-hemorrhage syndrome: Secondary | ICD-10-CM

## 2022-11-01 DIAGNOSIS — B20 Human immunodeficiency virus [HIV] disease: Secondary | ICD-10-CM | POA: Insufficient documentation

## 2022-11-01 DIAGNOSIS — Z713 Dietary counseling and surveillance: Secondary | ICD-10-CM | POA: Insufficient documentation

## 2022-11-01 DIAGNOSIS — Z8719 Personal history of other diseases of the digestive system: Secondary | ICD-10-CM | POA: Insufficient documentation

## 2022-11-01 MED ORDER — CHLORHEXIDINE GLUCONATE 0.12 % MT SOLN: 15.0000 mL | Freq: Two times a day (BID) | OROMUCOSAL | 0 refills | Status: DC

## 2022-11-01 MED ORDER — PANTOPRAZOLE SODIUM 40 MG PO TBEC
40.0000 mg | DELAYED_RELEASE_TABLET | Freq: Every day | ORAL | 3 refills | Status: DC
Start: 2022-11-01 — End: 2023-05-07

## 2022-11-01 NOTE — Assessment & Plan Note (Signed)
Improved with dental care will give the patient Peridex mouth rinse

## 2022-11-01 NOTE — Progress Notes (Signed)
New Patient Office Visit  Subjective    Patient ID: Joshua Leon., adult    DOB: 1996-01-13  Age: 27 y.o. MRN: [id number]  CC:  Chief Complaint  Patient presents with   Follow-up    HPI 01/31/22  Joshua Leon. presents to establish care This is a 27 year old male gender identified at birth now transitioning to male.  Patient has history of HIV diagnosed in December 2022.  Followed closely by the HIV clinic is on every 2 months injectable therapy.  Patient was just hospitalized August with COVID infection manifested by severe gastrointestinal inflammation and incessant nausea and vomiting with Mallory-Weiss tear and upper GI bleed with anemia.  Patient needs follow-up as an outpatient with gastroenterology this is yet to be achieved.  Patient does not smoke tobacco products but does use marijuana.  Her CD4 counts have been good on therapy.  She was starting now estrogen therapy to transition to male gender identification.  The management of the estrogen therapy has been under HIV infectious disease clinic.  On arrival blood pressure 123/70 and below is documentation from the discharge summary Admit date:     01/01/2022  Discharge date: 01/03/22  Discharge Physician: Jon Billings A Regalado   Recommendations at discharge:    Follow up with ID for further care HIV.      Discharge Diagnoses: Principal Problem:   GIB (gastrointestinal bleeding) Active Problems:   Mallory-Weiss tear   Human immunodeficiency virus (HIV) disease (HCC)   COVID-19 virus infection   Resolved Problems:   * No resolved hospital problems. *   Hospital Course: 27 year old with past medical history significant for HIV presents complaining of dark stool, nausea and vomiting.  She states that the morning of admission on her way to work she developed nausea followed by episode of vomiting with a small amount of blood.  Patient was found to be positive for COVID.  Chest x-ray no acute abnormality. He  was evaluated by GI and underwent endoscopy on 80/80/2023.  10 mm nonbleeding Mallory-Weiss tear noted treated with 4 clips.  2 cm hiatal hernia also noted.  Patient was a started on PPI for 1 months per GI recommendation. Started on Paxlovid for COVID 19 virus infection.  Appears to be asymptomatic from Respiratory symptoms currently.   Assessment and Plan: 1-GI bleed, secondary to Mallory-Weiss tear Presented with nausea vomiting, hematemesis.  Hemoglobin on admission at 12 from a baseline of 16.  Subsequently hemoglobin has remained stable around 9. He was started on PPI, will provide refill for 1 month. He underwent endoscopy 80/80/2023.  10 mm nonbleeding Mallory-Weiss tear noted treated with 4 clips.   2-COVID-19 virus infection: Screening positive on admission.  GI symptoms may be associated.  He does not have any respiratory symptoms.  He was started on Paxlovid.  He will complete treatment.   3-human immunodeficiency virus Thank you 2 months Cabenuva.  Last CD4 581.  Continue to follow-up with Dr. Darrold Span           Consultants: Dr Elnoria Howard  Procedures performed: Endoscopy  Below is documentation for the last infectious disease visit ID OV [id number] Doing well on Cabenuva and dose given today.  No changes and has follow up in October scheduled.  Labs done earlier this month.   I discussed hormone therapy and will start with 10 mg of estradiol IM and will schedule a nurse visit.  Discussed it will be every 2 weeks.  Follow up in 2 months.  Can consider a dose increase and spironalactone, as appropriate.    The patient has no other complaints  06/05/22   Patient seen in return follow-up today complains of a cracked left lower molar.  Also still having chest pain difficulty swallowing.  She had a Mallory-Weiss tear this past fall was seen by Dr. Elnoria Howard however the practice declines to see her as outpatient follow-up because she has Medicaid.  The [place] practice also declined to see her  as well because she had seen the other gastroenterology practice in the hospital.  Another issue is that she would like STD screening.  She is on HIV therapy and is compliant.  11/01/22 This patient is seen in return for follow-up  The patient follows with infectious disease HIV clinic.  She has transition from male to male.  Patient is still injecting estrogen therapy for this transition.  Patient had a fractured tooth when we last saw her in January of this year.  She went to the dental clinic sponsored by the HIV clinic and had this tooth removed.  She had a Mallory-Weiss tear in the past she saw gastroenterology they declined to do an endoscopy wanted to be on pantoprazole however prescription was never sent.  She still having reflux symptoms.  There are no other complaints.  Labs are monitored by infectious disease.  Outpatient Encounter Medications as of 11/01/2022  Medication Sig   cabotegravir & rilpivirine ER (CABENUVA) 600 & 900 MG/3ML injection Inject 1 kit into the muscle every 2 (two) months.   chlorhexidine (PERIDEX) 0.12 % solution Use as directed 15 mLs in the mouth or throat 2 (two) times daily.   Estradiol Valerate 10 MG/ML OIL Inject 1 mL (10 mg total) into the muscle every 14 (fourteen) days.   Needles & Syringes MISC 1 Units by Does not apply route every 14 (fourteen) days.   pantoprazole (PROTONIX) 40 MG tablet Take 1 tablet (40 mg total) by mouth daily.   omeprazole (PRILOSEC) 40 MG capsule Take 1 capsule (40 mg total) by mouth daily. Take 1 capsule 30 minutes prior to breakfast meal each day. (Patient not taking: Reported on 11/01/2022)   [DISCONTINUED] doxycycline (VIBRA-TABS) 100 MG tablet Take two tablets (200mg ) by mouth as needed within 72 hours after unprotected sex. (Patient not taking: Reported on 11/01/2022)   [DISCONTINUED] Multiple Vitamins-Minerals (HAIR VITAMINS) TABS Take 1 tablet by mouth daily at 2 PM. (Patient not taking: Reported on 11/01/2022)   No  facility-administered encounter medications on file as of 11/01/2022.    Past Medical History:  Diagnosis Date   COVID-19 virus infection 01/01/2022   Mallory-Weiss tear    Open fracture of tooth 06/05/2022   Pneumonia     Past Surgical History:  Procedure Laterality Date   ESOPHAGOGASTRODUODENOSCOPY (EGD) WITH PROPOFOL N/A 01/02/2022   Procedure: ESOPHAGOGASTRODUODENOSCOPY (EGD) WITH PROPOFOL;  Surgeon: Jeani Hawking, MD;  Location: [facility];  Service: Gastroenterology;  Laterality: N/A;  Hematememesis, anemia, blood in stool.I   HEMOSTASIS CLIP PLACEMENT  01/02/2022   Procedure: HEMOSTASIS CLIP PLACEMENT;  Surgeon: Jeani Hawking, MD;  Location: [facility];  Service: Gastroenterology;;    Family History  Problem Relation Age of Onset   Graves' disease Mother    Thyroid disease Mother    Sarcoidosis Maternal Aunt    Hypertension Maternal Grandmother    Cancer Neg Hx    Heart failure Neg Hx    Hyperlipidemia Neg Hx    Stomach cancer Neg Hx    Pancreatic cancer Neg  Hx    Esophageal cancer Neg Hx    Colon cancer Neg Hx    Colon polyps Neg Hx     Social History   Socioeconomic History   Marital status: Single    Spouse name: Not on file   Number of children: Not on file   Years of education: Not on file   Highest education level: Not on file  Occupational History   Not on file  Tobacco Use   Smoking status: Former    Types: Cigars   Smokeless tobacco: Never  Vaping Use   Vaping Use: Never used  Substance and Sexual Activity   Alcohol use: Yes    Comment: ocassionally   Drug use: Yes    Frequency: 7.0 times per week    Types: Marijuana   Sexual activity: Yes    Partners: Female, Male    Comment: accepted condoms  Other Topics Concern   Not on file  Social History Narrative   Not on file   Social Determinants of Health   Financial Resource Strain: Not on file  Food Insecurity: Not on file  Transportation Needs: Not on file  Physical Activity: Not on  file  Stress: Not on file  Social Connections: Not on file  Intimate Partner Violence: Not on file    Review of Systems  Constitutional:  Negative for chills, diaphoresis, fever, malaise/fatigue and weight loss.  HENT:  Negative for congestion, hearing loss, nosebleeds, sore throat and tinnitus.        Fractured left lower molar  Eyes:  Negative for blurred vision, photophobia and redness.  Respiratory:  Negative for cough, hemoptysis, sputum production, shortness of breath, wheezing and stridor.   Cardiovascular:  Negative for chest pain, palpitations, orthopnea, claudication, leg swelling and PND.  Gastrointestinal:  Positive for heartburn. Negative for abdominal pain, blood in stool, constipation, diarrhea, nausea and vomiting.  Genitourinary:  Negative for dysuria, flank pain, frequency, hematuria and urgency.  Musculoskeletal:  Negative for back pain, falls, joint pain, myalgias and neck pain.  Skin:  Negative for itching and rash.  Neurological:  Negative for dizziness, tingling, tremors, sensory change, speech change, focal weakness, seizures, loss of consciousness, weakness and headaches.  Endo/Heme/Allergies:  Negative for environmental allergies and polydipsia. Does not bruise/bleed easily.  Psychiatric/Behavioral:  Negative for depression, memory loss, substance abuse and suicidal ideas. The patient is not nervous/anxious and does not have insomnia.         Objective    BP 136/86   Pulse 94   Ht 5\' 9"  (1.753 m)   Wt 204 lb 12.8 oz (92.9 kg)   SpO2 98%   BMI 30.24 kg/m   Physical Exam Vitals reviewed.  Constitutional:      Appearance: Normal appearance. She is well-developed. She is not diaphoretic.  HENT:     Head: Normocephalic and atraumatic.     Nose: No nasal deformity, septal deviation, mucosal edema or rhinorrhea.     Right Sinus: No maxillary sinus tenderness or frontal sinus tenderness.     Left Sinus: No maxillary sinus tenderness or frontal sinus  tenderness.     Mouth/Throat:     Pharynx: No oropharyngeal exudate.     Comments: Fractured left lower molar with dental caries Eyes:     General: No scleral icterus.    Conjunctiva/sclera: Conjunctivae normal.     Pupils: Pupils are equal, round, and reactive to light.  Neck:     Thyroid: No thyromegaly.     Vascular:  No carotid bruit or JVD.     Trachea: Trachea normal. No tracheal tenderness or tracheal deviation.  Cardiovascular:     Rate and Rhythm: Normal rate and regular rhythm.     Chest Wall: PMI is not displaced.     Pulses: Normal pulses. No decreased pulses.     Heart sounds: Normal heart sounds, S1 normal and S2 normal. Heart sounds not distant. No murmur heard.    No systolic murmur is present.     No diastolic murmur is present.     No friction rub. No gallop. No S3 or S4 sounds.  Pulmonary:     Effort: No tachypnea, accessory muscle usage or respiratory distress.     Breath sounds: No stridor. No decreased breath sounds, wheezing, rhonchi or rales.  Chest:     Chest wall: No tenderness.  Abdominal:     General: Bowel sounds are normal. There is no distension.     Palpations: Abdomen is soft. Abdomen is not rigid.     Tenderness: There is no abdominal tenderness. There is no guarding or rebound.  Musculoskeletal:        General: Normal range of motion.     Cervical back: Normal range of motion and neck supple. No edema, erythema or rigidity. No muscular tenderness. Normal range of motion.  Lymphadenopathy:     Head:     Right side of head: No submental or submandibular adenopathy.     Left side of head: No submental or submandibular adenopathy.     Cervical: No cervical adenopathy.  Skin:    General: Skin is warm and dry.     Coloration: Skin is not pale.     Findings: No rash.     Nails: There is no clubbing.  Neurological:     Mental Status: She is alert and oriented to person, place, and time.     Sensory: No sensory deficit.  Psychiatric:         Speech: Speech normal.        Behavior: Behavior normal.         Assessment & Plan:   Problem List Items Addressed This Visit       Digestive   Mallory-Weiss tear - Primary    According to gastroenterology this is healed will prescribe pantoprazole 40 mg daily      Dental caries    Improved with dental care will give the patient Peridex mouth rinse      RESOLVED: Open fracture of tooth    Tooth has been extracted this is resolved        Other   Human immunodeficiency virus (HIV) disease (HCC)    Managed by HIV clinic     Return in about 6 months (around 05/03/2023) for primary care follow up.   Shan Levans, MD

## 2022-11-01 NOTE — Patient Instructions (Signed)
Mouthwash and acid medication sent to [facility] Return Dr Delford Field 6 months

## 2022-11-01 NOTE — Assessment & Plan Note (Signed)
According to gastroenterology this is healed will prescribe pantoprazole 40 mg daily

## 2022-11-01 NOTE — Assessment & Plan Note (Signed)
Tooth has been extracted this is resolved

## 2022-11-01 NOTE — Assessment & Plan Note (Signed)
Managed by HIV clinic

## 2022-11-13 ENCOUNTER — Telehealth: Payer: Self-pay

## 2022-11-13 NOTE — Telephone Encounter (Signed)
[facility] Patient Advocate Encounter  Patient's medication Joshua Leon) have been couriered to [facility] from [employer] and will be administered on the patient next office visit on 11/20/22.  [name] , CPhT Specialty Pharmacy [name] Infectious [facility] Phone: [phone] Fax:  [phone]

## 2022-11-20 ENCOUNTER — Encounter: Payer: Medicaid Other | Admitting: Pharmacist

## 2022-11-20 ENCOUNTER — Encounter: Payer: Self-pay | Admitting: Pharmacist

## 2022-12-04 ENCOUNTER — Other Ambulatory Visit: Payer: Self-pay

## 2022-12-04 ENCOUNTER — Ambulatory Visit (INDEPENDENT_AMBULATORY_CARE_PROVIDER_SITE_OTHER): Payer: Self-pay | Admitting: Pharmacist

## 2022-12-04 ENCOUNTER — Other Ambulatory Visit (HOSPITAL_COMMUNITY): Payer: Self-pay

## 2022-12-04 DIAGNOSIS — B20 Human immunodeficiency virus [HIV] disease: Secondary | ICD-10-CM

## 2022-12-04 DIAGNOSIS — Z113 Encounter for screening for infections with a predominantly sexual mode of transmission: Secondary | ICD-10-CM

## 2022-12-04 MED ORDER — CABOTEGRAVIR & RILPIVIRINE ER 600 & 900 MG/3ML IM SUER
1.00 | Freq: Once | INTRAMUSCULAR | Status: AC
Start: 2022-12-04 — End: 2022-12-04
  Administered 2022-12-04: 1 via INTRAMUSCULAR

## 2022-12-04 MED ORDER — DOXYCYCLINE HYCLATE 100 MG PO TABS
ORAL_TABLET | ORAL | 2 refills | Status: DC
Start: 2022-12-04 — End: 2023-03-19

## 2022-12-04 NOTE — Progress Notes (Signed)
HPI: Joshua Leon. is a 27 y.o. adult who presents to the [facility] pharmacy clinic for [place] administration.  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   CHLORHEXIDINE (PERIDEX) 0.12 % SOLUTION    Use as directed 15 mLs in the mouth or throat 2 (two) times daily.   ESTRADIOL VALERATE 10 MG/ML OIL    Inject 1 mL (10 mg total) into the muscle every 14 (fourteen) days.   NEEDLES & SYRINGES MISC    1 Units by Does not apply route every 14 (fourteen) days.   OMEPRAZOLE (PRILOSEC) 40 MG CAPSULE    Take 1 capsule (40 mg total) by mouth daily. Take 1 capsule 30 minutes prior to breakfast meal each day.   PANTOPRAZOLE (PROTONIX) 40 MG TABLET    Take 1 tablet (40 mg total) by mouth daily.  Modified Medications   No medications on file  Discontinued Medications   No medications on file    Allergies: No Known Allergies  Past Medical History: Past Medical History:  Diagnosis Date   COVID-19 virus infection 01/01/2022   Mallory-Weiss tear    Open fracture of tooth 06/05/2022   Pneumonia     Social History: Social History   Socioeconomic History   Marital status: Single    Spouse name: Not on file   Number of children: Not on file   Years of education: Not on file   Highest education level: Not on file  Occupational History   Not on file  Tobacco Use   Smoking status: Former    Types: Cigars    Smokeless tobacco: Never  Vaping Use   Vaping Use: Never used  Substance and Sexual Activity   Alcohol use: Yes    Comment: ocassionally   Drug use: Yes    Frequency: 7.0 times per week    Types: Marijuana   Sexual activity: Yes    Partners: Female, Male    Comment: accepted condoms  Other Topics Concern   Not on file  Social History Narrative   Not on file   Social Determinants of Health   Financial Resource Strain: Not on file  Food Insecurity: Not on file  Transportation Needs: Not on file  Physical Activity: Not on file  Stress: Not on file  Social Connections: Not on file    Labs: Lab Results  Component Value Date   HIV1RNAQUANT Not Detected 07/24/2022   HIV1RNAQUANT Not Detected 03/15/2022   HIV1RNAQUANT <20 01/01/2022   CD4TABS 463 07/24/2022   CD4TABS 817 01/01/2022   CD4TABS 581 08/31/2021    RPR and STI Lab Results  Component Value Date   LABRPR REACTIVE (A) 09/06/2022   LABRPR REACTIVE (A) 08/21/2022   LABRPR REACTIVE (A) 07/24/2022   LABRPR REACTIVE (A) 06/07/2022   LABRPR REACTIVE (A) 04/30/2022   RPRTITER 1:1 (H) 09/06/2022   RPRTITER 1:4 (H) 08/21/2022   RPRTITER 1:4 (H) 07/24/2022   RPRTITER 1:4 (  H) 06/07/2022   RPRTITER 1:4 (H) 04/30/2022    STI Results GC CT  09/06/2022  1:46 PM Negative    Negative    Negative  Negative    Negative    Negative   08/21/2022 11:05 AM Negative  Negative   08/21/2022 10:12 AM Negative  Negative   07/24/2022 11:29 AM Negative    Negative  Negative    Negative   06/07/2022 10:54 AM Negative    Negative    Negative  Negative    Negative    Negative   05/16/2022 11:32 AM Negative    Negative  Negative    Negative   05/16/2022 11:21 AM Negative  Negative   04/30/2022 11:00 AM Positive    Negative    Negative  Negative    Negative    Negative   03/15/2022  2:55 PM Negative    Negative    Negative  Negative    Negative    Negative   11/16/2021 11:40 AM Negative    Negative    Negative   Negative    Negative    Negative   10/18/2021  3:30 PM Other    Negative  Other    Negative   08/31/2021  9:07 AM Negative    Positive  Negative    Negative   04/28/2021 11:01 AM Negative    Negative  Negative    Negative     Hepatitis B Lab Results  Component Value Date   HEPBSAB REACTIVE (A) 07/24/2022   HEPBSAG NON-REACTIVE 04/28/2021   HEPBCAB NON-REACTIVE 04/28/2021   Hepatitis C Lab Results  Component Value Date   HEPCAB NON-REACTIVE 04/28/2021   Hepatitis A Lab Results  Component Value Date   HAV REACTIVE (A) 04/28/2021   Lipids: Lab Results  Component Value Date   CHOL 182 07/24/2022   TRIG 67 07/24/2022   HDL 64 07/24/2022   CHOLHDL 2.8 07/24/2022   LDLCALC 103 (H) 07/24/2022    TARGET DATE:  The 24th of the month  Current HIV Regimen: Cabenuva  Assessment: Mortimer presents today for their maintenance Cabenuva injections. Initial/past injections were tolerated well without issues. No problems with systemic effects of injections. She missed her appointment at the end of June and is currently 8 days outside of her window. States she normally receives calls about her appointments and was waiting on one. Helped her re-login to her MyChart account so that she can see appointment reminders. Emphasized the importance of staying within her window because of the risk of drug resistance.   Administered cabotegravir 600mg /18mL in left upper outer quadrant of the gluteal muscle. Administered rilpivirine 900 mg/83mL in the right upper outer quadrant of the gluteal muscle. Monitored patient for 10 minutes after injection. Injections were tolerated well without issue.   Will check HIV RNA today along with RPR and urine/oral/rectal cytologies; denies any recent sexual partners. Patient states she was not able to pick up doxyPEP as prescription was discontinued; resent in script to [facility] for her to pick up. Due for 2nd MPX vaccine which she will need to receive through the  HD. Met with Deanna today to either renew HMAP or sign up for full Medicaid as she only has family planning right now.   Plan: - Cabenuva injections administered - Check HIV RNA, RPR, and urine/oral/rectal cytologies  - Refill doxyPEP prescription  - Next injections scheduled for 8/20 with Dr. Luciana Axe and 10/22 with me  - Call with any issues or questions  Margarite Gouge, PharmD, CPP, BCIDP, AAHIVP Clinical Pharmacist Practitioner Infectious Diseases Clinical Pharmacist [facility]

## 2022-12-05 ENCOUNTER — Telehealth: Payer: Self-pay | Admitting: Pharmacist

## 2022-12-05 DIAGNOSIS — A749 Chlamydial infection, unspecified: Secondary | ICD-10-CM

## 2022-12-05 LAB — URINE CYTOLOGY ANCILLARY ONLY
Chlamydia: NEGATIVE
Comment: NEGATIVE
Comment: NORMAL
Neisseria Gonorrhea: NEGATIVE

## 2022-12-05 LAB — CYTOLOGY, (ORAL, ANAL, URETHRAL) ANCILLARY ONLY
Chlamydia: NEGATIVE
Chlamydia: POSITIVE — AB
Comment: NEGATIVE
Comment: NEGATIVE
Comment: NORMAL
Comment: NORMAL
Neisseria Gonorrhea: NEGATIVE
Neisseria Gonorrhea: NEGATIVE

## 2022-12-05 MED ORDER — DOXYCYCLINE HYCLATE 100 MG PO TABS
100.0000 mg | ORAL_TABLET | Freq: Two times a day (BID) | ORAL | 0 refills | Status: DC
Start: 2022-12-05 — End: 2023-12-04

## 2022-12-05 NOTE — Telephone Encounter (Signed)
Rectal cytologies returned positive for chlamydia; reviewed results with patient. Sent doxycycline 100mg  BID x  7 days to [facility] on [address]. Counseled to take doxycycline with food as it will cause stomach upset, to sit up for at least 30 minutes to 1 hour after taking his dose to prevent esophageal burning, and to avoid dairy products or other cations within a few hours of taking doxycycline. Verbalized understanding and all questions were answered.  Margarite Gouge, PharmD, CPP, BCIDP, AAHIVP Clinical Pharmacist Practitioner Infectious Diseases Clinical Pharmacist [facility]

## 2022-12-06 ENCOUNTER — Emergency Department (HOSPITAL_COMMUNITY)
Admission: EM | Admit: 2022-12-06 | Discharge: 2022-12-07 | Disposition: A | Discharge status: Home / Self Care | Payer: No Typology Code available for payment source | Attending: Emergency Medicine | Admitting: Emergency Medicine

## 2022-12-06 DIAGNOSIS — Y92410 Unspecified street and highway as the place of occurrence of the external cause: Secondary | ICD-10-CM | POA: Insufficient documentation

## 2022-12-06 DIAGNOSIS — M79651 Pain in right thigh: Secondary | ICD-10-CM | POA: Insufficient documentation

## 2022-12-06 DIAGNOSIS — Z21 Asymptomatic human immunodeficiency virus [HIV] infection status: Secondary | ICD-10-CM | POA: Diagnosis not present

## 2022-12-06 NOTE — Progress Notes (Signed)
Your syphilis titer has stayed ~1:4 since October 2023 after your initial titer with your infection was 1:16. It most recently dropped to 1:1 in April and now has increased back to 1:4. Given you have not been sexually active recently and do not have any symptoms consistent with syphilis, I think this just demonstrates a variation with how the labs can result sometimes. We will continue to monitor your titer for now. Let me know if you have any questions!

## 2022-12-07 ENCOUNTER — Other Ambulatory Visit: Payer: Self-pay

## 2022-12-07 LAB — T PALLIDUM AB: T Pallidum Abs: POSITIVE — AB

## 2022-12-07 LAB — RPR TITER: RPR Titer: 1:4 {titer} — ABNORMAL HIGH

## 2022-12-07 LAB — RPR: RPR Ser Ql: REACTIVE — AB

## 2022-12-07 LAB — HIV-1 RNA QUANT-NO REFLEX-BLD
HIV 1 RNA Quant: NOT DETECTED Copies/mL
HIV-1 RNA Quant, Log: NOT DETECTED Log cps/mL

## 2022-12-07 MED ORDER — METHOCARBAMOL 500 MG PO TABS: 500.0000 mg | ORAL_TABLET | Freq: Two times a day (BID) | ORAL | 0 refills | Status: DC
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
## 2022-12-20 ENCOUNTER — Telehealth: Payer: Self-pay | Admitting: Pharmacist

## 2022-12-20 NOTE — Telephone Encounter (Signed)
Joshua Leon with DIS [phone]) called today regarding patient's recent RPR titer. Joshua Leon's initial titer was 1:16 which she first tested positive for syphilis, and it decreased nicely to 1:4 after 6 months. It has remained serofast at 1:4 until April when it decreased to 1:1. Now, it increased back up to 1:4 in July which I attributed to a margin of error in the lab test given Joshua Leon or STI symptoms. She did test positive for chlamydia at that visit and then admitted to some rectal discharge today. Today, she tells me she was sexually active at the end of June. Agree with DIS to conservatively treat for primary syphilis with Bicillin 2.4 million units x 1. Patient scheduled tomorrow for treatment.   Margarite Gouge, PharmD, CPP, BCIDP, AAHIVP Clinical Pharmacist Practitioner Infectious Diseases Clinical Pharmacist [facility] for Infectious Disease

## 2022-12-21 ENCOUNTER — Ambulatory Visit (INDEPENDENT_AMBULATORY_CARE_PROVIDER_SITE_OTHER): Payer: Self-pay | Admitting: Pharmacist

## 2022-12-21 ENCOUNTER — Other Ambulatory Visit: Payer: Self-pay

## 2022-12-21 DIAGNOSIS — A539 Syphilis, unspecified: Secondary | ICD-10-CM

## 2022-12-21 MED ORDER — PENICILLIN G BENZATHINE 1200000 UNIT/2ML IM SUSY
2.4000 10*6.[IU] | PREFILLED_SYRINGE | Freq: Once | INTRAMUSCULAR | Status: AC
Start: 2022-12-21 — End: 2022-12-21
  Administered 2022-12-21: 2.4 10*6.[IU] via INTRAMUSCULAR

## 2022-12-21 NOTE — Progress Notes (Signed)
   [facility] STI Visit  HPI: Joshua Ohr. is a 27 y.o. adult who presents to the [facility] pharmacy clinic for STI follow-up and treatment.  Hepatitis B Lab Results  Component Value Date   HEPBSAB REACTIVE (A) 07/24/2022   Lab Results  Component Value Date   HEPBSAG NON-REACTIVE 04/28/2021    Hepatitis C No results found for: "HCVAB"  Hepatitis A Lab Results  Component Value Date   HAV REACTIVE (A) 04/28/2021    Assessment & Plan: - Administered Bicillin 2.4 million units x 1 for possible syphilis infection  - Patient tolerated well - Patient aware to refrain from sexual activity x 7 days and to inform partners of test results  Margarite Gouge, PharmD, CPP, BCIDP, AAHIVP Clinical Pharmacist Practitioner Infectious Diseases Clinical Pharmacist [facility] 12/21/2022, 9:55 AM

## 2022-12-30 ENCOUNTER — Other Ambulatory Visit: Payer: Self-pay | Admitting: Pharmacist

## 2022-12-30 DIAGNOSIS — B20 Human immunodeficiency virus [HIV] disease: Secondary | ICD-10-CM

## 2023-01-07 ENCOUNTER — Ambulatory Visit
Admission: EM | Admit: 2023-01-07 | Discharge: 2023-01-07 | Disposition: A | Discharge status: Home / Self Care | Payer: Medicaid Other | Attending: Urgent Care | Admitting: Urgent Care

## 2023-01-07 DIAGNOSIS — B20 Human immunodeficiency virus [HIV] disease: Secondary | ICD-10-CM | POA: Insufficient documentation

## 2023-01-07 DIAGNOSIS — B349 Viral infection, unspecified: Secondary | ICD-10-CM | POA: Insufficient documentation

## 2023-01-07 DIAGNOSIS — Z1152 Encounter for screening for COVID-19: Secondary | ICD-10-CM | POA: Insufficient documentation

## 2023-01-07 LAB — POCT INFLUENZA A/B
Influenza A, POC: NEGATIVE
Influenza B, POC: NEGATIVE

## 2023-01-07 MED ORDER — PSEUDOEPHEDRINE HCL 60 MG PO TABS: 60.0000 mg | ORAL_TABLET | Freq: Three times a day (TID) | ORAL | 0 refills | Status: DC | PRN

## 2023-01-07 MED ORDER — CETIRIZINE HCL 10 MG PO TABS: 10.0000 mg | ORAL_TABLET | Freq: Every day | ORAL | 0 refills | Status: DC

## 2023-01-07 MED ORDER — PROMETHAZINE-DM 6.25-15 MG/5ML PO SYRP: 5.0000 mL | ORAL_SOLUTION | Freq: Three times a day (TID) | ORAL | 0 refills | Status: DC | PRN

## 2023-01-07 NOTE — ED Triage Notes (Signed)
Pt c/o nasal congestion, chills x 2 days-HA x today-NAD-steady gait

## 2023-01-07 NOTE — ED Provider Notes (Signed)
[facility]  Note:  This document was prepared using Conservation officer, historic buildings and may include unintentional dictation errors.  MRN: [id number] DOB: 04-21-1996  Subjective:   Joshua Leon. is a 27 y.o. adult presenting for 2-day history of acute onset persistent sinus congestion, chills, sinus pressure, drainage, slight coughing.  Would like to be checked for flu and COVID.  Patient smokes marijuana regularly.  Has HIV disease and is compliant with his medication, Cabenuva injections.  No history of respiratory disorders.  He does have a history of pneumonia, does not want a chest x-ray.  No chest pain, shortness of breath or wheezing.  No current facility-administered medications for this encounter.  Current Outpatient Medications:    CABENUVA 600 & 900 MG/3ML injection, INJECT 1 KIT INTO THE MUSCLE EVERY 2 MONTHS, Disp: 6 mL, Rfl: 5   chlorhexidine (PERIDEX) 0.12 % solution, Use as directed 15 mLs in the mouth or throat 2 (two) times daily., Disp: 120 mL, Rfl: 0   doxycycline (VIBRA-TABS) 100 MG tablet, Take two tablets (200mg ) by mouth as needed within 72 hours after unprotected sex., Disp: 60 tablet, Rfl: 2   doxycycline (VIBRA-TABS) 100 MG tablet, Take 1 tablet (100 mg total) by mouth 2 (two) times daily., Disp: 14 tablet, Rfl: 0   Estradiol Valerate 10 MG/ML OIL, Inject 1 mL (10 mg total) into the muscle every 14 (fourteen) days., Disp: 1 mL, Rfl: 25   methocarbamol (ROBAXIN) 500 MG tablet, Take 1 tablet (500 mg total) by mouth 2 (two) times daily., Disp: 20 tablet, Rfl: 0   Needles & Syringes MISC, 1 Units by Does not apply route every 14 (fourteen) days., Disp: 30 Units, Rfl: 11   omeprazole (PRILOSEC) 40 MG capsule, Take 1 capsule (40 mg total) by mouth daily. Take 1 capsule 30 minutes prior to breakfast meal each day. (Patient not taking: Reported on 11/01/2022), Disp: 30 capsule, Rfl: 2   pantoprazole (PROTONIX) 40 MG tablet, Take 1 tablet (40 mg  total) by mouth daily., Disp: 30 tablet, Rfl: 3   No Known Allergies  Past Medical History:  Diagnosis Date   COVID-19 virus infection 01/01/2022   Mallory-Weiss tear    Open fracture of tooth 06/05/2022   Pneumonia      Past Surgical History:  Procedure Laterality Date   ESOPHAGOGASTRODUODENOSCOPY (EGD) WITH PROPOFOL N/A 01/02/2022   Procedure: ESOPHAGOGASTRODUODENOSCOPY (EGD) WITH PROPOFOL;  Surgeon: Jeani Hawking, MD;  Location: [facility];  Service: Gastroenterology;  Laterality: N/A;  Hematememesis, anemia, blood in stool.I   HEMOSTASIS CLIP PLACEMENT  01/02/2022   Procedure: HEMOSTASIS CLIP PLACEMENT;  Surgeon: Jeani Hawking, MD;  Location: [facility];  Service: Gastroenterology;;    Family History  Problem Relation Age of Onset   Graves' disease Mother    Thyroid disease Mother    Sarcoidosis Maternal Aunt    Hypertension Maternal Grandmother    Cancer Neg Hx    Heart failure Neg Hx    Hyperlipidemia Neg Hx    Stomach cancer Neg Hx    Pancreatic cancer Neg Hx    Esophageal cancer Neg Hx    Colon cancer Neg Hx    Colon polyps Neg Hx     Social History   Tobacco Use   Smoking status: Former    Types: Cigars   Smokeless tobacco: Never  Vaping Use   Vaping status: Never Used  Substance Use Topics   Alcohol use: Yes    Comment: ocassionally  Drug use: Yes    Types: Marijuana    ROS   Objective:   Vitals: BP 135/77 (BP Location: Right Arm)   Pulse 79   Temp 100.3 F (37.9 C) (Oral)   Resp 20   SpO2 96%   Physical Exam Constitutional:      General: She is not in acute distress.    Appearance: Normal appearance. She is well-developed and normal weight. She is not ill-appearing, toxic-appearing or diaphoretic.  HENT:     Head: Normocephalic and atraumatic.     Right Ear: Tympanic membrane, ear canal and external ear normal. No drainage or tenderness. No middle ear effusion. There is no impacted cerumen. Tympanic membrane is not erythematous or  bulging.     Left Ear: Tympanic membrane, ear canal and external ear normal. No drainage or tenderness.  No middle ear effusion. There is no impacted cerumen. Tympanic membrane is not erythematous or bulging.     Nose: Nose normal. No congestion or rhinorrhea.     Mouth/Throat:     Mouth: Mucous membranes are moist. No oral lesions.     Pharynx: No pharyngeal swelling, oropharyngeal exudate, posterior oropharyngeal erythema or uvula swelling.     Tonsils: No tonsillar exudate or tonsillar abscesses.  Eyes:     General: No scleral icterus.       Right eye: No discharge.        Left eye: No discharge.     Extraocular Movements: Extraocular movements intact.     Right eye: Normal extraocular motion.     Left eye: Normal extraocular motion.     Conjunctiva/sclera: Conjunctivae normal.  Cardiovascular:     Rate and Rhythm: Normal rate and regular rhythm.     Heart sounds: Normal heart sounds. No murmur heard.    No friction rub. No gallop.  Pulmonary:     Effort: Pulmonary effort is normal. No respiratory distress.     Breath sounds: Normal breath sounds. No stridor. No wheezing, rhonchi or rales.  Musculoskeletal:     Cervical back: Normal range of motion and neck supple.  Lymphadenopathy:     Cervical: No cervical adenopathy.  Skin:    General: Skin is warm and dry.  Neurological:     General: No focal deficit present.     Mental Status: She is alert and oriented to person, place, and time.  Psychiatric:        Mood and Affect: Mood normal.        Behavior: Behavior normal.        Thought Content: Thought content normal.    Results for orders placed or performed during the hospital encounter of 01/07/23 (from the past 24 hour(s))  POCT Influenza A/B     Status: None   Collection Time: 01/07/23  1:38 PM  Result Value Ref Range   Influenza A, POC Negative Negative   Influenza B, POC Negative Negative    Assessment and Plan :   PDMP not reviewed this encounter.  1. Acute  viral syndrome   2. HIV disease (HCC)    Patient is high risk due to his HIV disease.  Recommend using molnupiravir should he test positive for COVID-19 as there are medication reactions with Paxlovid.  Otherwise use supportive care for an acute viral syndrome.  Patient declined chest x-ray.  Counseled patient on potential for adverse effects with medications prescribed/recommended today, ER and return-to-clinic precautions discussed, patient verbalized understanding.    Wallis Bamberg, PA-C 01/07/23 1414

## 2023-01-07 NOTE — Discharge Instructions (Addendum)
We will notify you of your test results as they arrive and may take between about 24 hours.  I encourage you to sign up for MyChart if you have not already done so as this can be the easiest way for us to communicate results to you online or through a phone app.  Generally, we only contact you if it is a positive test result.  In the meantime, if you develop worsening symptoms including fever, chest pain, shortness of breath despite our current treatment plan then please report to the emergency room as this may be a sign of worsening status from possible viral infection.  Otherwise, we will manage this as a viral syndrome. For sore throat or cough try using a honey-based tea. Use 3 teaspoons of honey with juice squeezed from half lemon. Place shaved pieces of ginger into 1/2-1 cup of water and warm over stove top. Then mix the ingredients and repeat every 4 hours as needed. Please take Tylenol 500mg-650mg every 6 hours for aches and pains, fevers. Hydrate very well with at least 2 liters of water. Eat light meals such as soups to replenish electrolytes and soft fruits, veggies. Start an antihistamine like Zyrtec (10mg daily) for postnasal drainage, sinus congestion.  You can take this together with pseudoephedrine (Sudafed) at a dose of 60 mg 2-3 times a day as needed for the same kind of congestion.  Use the cough medications as needed.   

## 2023-01-08 ENCOUNTER — Telehealth: Payer: Self-pay

## 2023-01-08 NOTE — Telephone Encounter (Signed)
[facility] Patient Advocate Encounter  Patient's medication Renaldo Harrison) have been couriered to [facility] from [employer] and will be administered on the patient next office visit on 01/15/23.  [name] , CPhT Specialty Pharmacy Patient [facility] Phone: [phone] Fax:  [phone]

## 2023-01-15 ENCOUNTER — Encounter: Payer: Self-pay | Admitting: Internal Medicine

## 2023-01-15 ENCOUNTER — Other Ambulatory Visit: Payer: Self-pay

## 2023-01-15 ENCOUNTER — Ambulatory Visit (INDEPENDENT_AMBULATORY_CARE_PROVIDER_SITE_OTHER): Payer: Self-pay | Admitting: Internal Medicine

## 2023-01-15 VITALS — BP 105/73 | HR 65 | Resp 16 | Ht 69.0 in | Wt 204.6 lb

## 2023-01-15 DIAGNOSIS — B20 Human immunodeficiency virus [HIV] disease: Secondary | ICD-10-CM

## 2023-01-15 MED ORDER — CABOTEGRAVIR & RILPIVIRINE ER 600 & 900 MG/3ML IM SUER
1.0000 | Freq: Once | INTRAMUSCULAR | Status: AC
Start: 2023-01-15 — End: 2023-01-15
  Administered 2023-01-15: 1 via INTRAMUSCULAR

## 2023-01-15 NOTE — Progress Notes (Signed)
   Subjective:    Patient ID: Joshua Leon., adult    DOB: Apr 06, 1996, 27 y.o.   MRN: [id number]  HPI Taitum is here for follow up of HIV She continues on [place] with no concerns.  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     Review of Systems  Constitutional:  Negative for fatigue.  Gastrointestinal:  Negative for diarrhea and nausea.       Objective:   Physical Exam Eyes:     General: No scleral icterus. Pulmonary:     Effort: Pulmonary effort is normal.  Skin:    Findings: No rash.  Neurological:     Mental Status: She is alert.           Assessment & Plan:

## 2023-01-15 NOTE — Addendum Note (Signed)
Addended by: Clayborne Artist A on: 01/15/2023 03:13 PM   Modules accepted: Orders

## 2023-01-15 NOTE — Assessment & Plan Note (Addendum)
Doing great on Cabenuva, no concerns.  Given today.  No changes indicated.   Continue with follow up every 2 months  I have personally spent 33 minutes involved in face-to-face and non-face-to-face activities for this patient on the day of the visit. Professional time spent includes the following activities: Preparing to see the patient (review of tests), Obtaining and/or reviewing separately obtained history (admission/discharge record), Performing a medically appropriate examination and/or evaluation , Ordering medications/tests/procedures, referring and communicating with other health care professionals, Documenting clinical information in the EMR, Independently interpreting results (not separately reported), Communicating results to the patient/family/caregiver, Counseling and educating the patient/family/caregiver and Care coordination (not separately reported).

## 2023-02-07 ENCOUNTER — Ambulatory Visit: Payer: Medicaid Other | Admitting: Pharmacist

## 2023-02-12 ENCOUNTER — Other Ambulatory Visit (HOSPITAL_COMMUNITY)
Admission: RE | Admit: 2023-02-12 | Discharge: 2023-02-12 | Disposition: A | Discharge status: Home / Self Care | Payer: Medicaid Other | Source: Ambulatory Visit | Attending: Internal Medicine | Admitting: Internal Medicine

## 2023-02-12 ENCOUNTER — Other Ambulatory Visit: Payer: Self-pay

## 2023-02-12 ENCOUNTER — Ambulatory Visit (INDEPENDENT_AMBULATORY_CARE_PROVIDER_SITE_OTHER): Payer: Self-pay | Admitting: Pharmacist

## 2023-02-12 DIAGNOSIS — Z113 Encounter for screening for infections with a predominantly sexual mode of transmission: Secondary | ICD-10-CM | POA: Insufficient documentation

## 2023-02-12 NOTE — Addendum Note (Signed)
Addended by: Harley Alto on: 02/12/2023 11:03 AM   Modules accepted: Orders

## 2023-02-12 NOTE — Progress Notes (Signed)
   [facility] STI Visit  HPI: Kyven Lutton. is a 27 y.o. adult who presents to the [facility] pharmacy clinic for STI testing.  Hepatitis B Lab Results  Component Value Date   HEPBSAB REACTIVE (A) 07/24/2022   Lab Results  Component Value Date   HEPBSAG NON-REACTIVE 04/28/2021    Hepatitis C No results found for: "HCVAB"  Hepatitis A Lab Results  Component Value Date   HAV REACTIVE (A) 04/28/2021    Assessment: Devlon presents to clinic today for routine STI screening as she has been active with a new sexual partner. No concerns or symptoms such as discharge. Will check RPR and urine/oral/rectal cytologies. Reviewed that it is too soon to see "test of cure" with RPR but want to make sure it has not increased since her treatment. Also states she never received her doxyPEP. Discussed she can call [facility] to fill her medication as I sent it their in July.   Plan: - Check RPR and urine/oral/rectal GC/CT cytologies - Pick up doxyPEP from [facility] - Follow up on results as needed   Margarite Gouge, PharmD, CPP, BCIDP, AAHIVP Clinical Pharmacist Practitioner Infectious Diseases Clinical Pharmacist [facility] 02/12/2023, 10:14 AM

## 2023-02-14 LAB — RPR: RPR Ser Ql: REACTIVE — AB

## 2023-02-14 LAB — T PALLIDUM AB: T Pallidum Abs: POSITIVE — AB

## 2023-02-14 LAB — RPR TITER: RPR Titer: 1:2 {titer} — ABNORMAL HIGH

## 2023-03-13 ENCOUNTER — Telehealth: Payer: Self-pay

## 2023-03-13 NOTE — Telephone Encounter (Signed)
[facility] Patient Advocate Encounter  Patient's medications (CABENUVA) have been couriered to [facility] from [facility] and will be administered at the patients appointment on 03/19/23.  Kae Heller, CPhT Specialty Pharmacy Patient [facility] Phone: [phone] Fax:  [phone]

## 2023-03-18 NOTE — Progress Notes (Unsigned)
HPI: Joshua Leon. is a 27 y.o. adult who presents to the [facility] pharmacy clinic for Amoret administration.  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   CHLORHEXIDINE (PERIDEX) 0.12 % SOLUTION    Use as directed 15 mLs in the mouth or throat 2 (two) times daily.   DOXYCYCLINE (VIBRA-TABS) 100 MG TABLET    Take two tablets (200mg ) by mouth as needed within 72 hours after unprotected sex.   DOXYCYCLINE (VIBRA-TABS) 100 MG TABLET    Take 1 tablet (100 mg total) by mouth 2 (two) times daily.   ESTRADIOL VALERATE 10 MG/ML OIL    Inject 1 mL (10 mg total) into the muscle every 14 (fourteen) days.   METHOCARBAMOL (ROBAXIN) 500 MG TABLET    Take 1 tablet (500 mg total) by mouth 2 (two) times daily.   NEEDLES & SYRINGES MISC    1 Units by Does not apply route every 14 (fourteen) days.   OMEPRAZOLE (PRILOSEC) 40 MG CAPSULE    Take 1 capsule (40 mg total) by mouth daily. Take 1 capsule 30 minutes prior to breakfast meal each day.   PANTOPRAZOLE (PROTONIX) 40 MG TABLET    Take 1 tablet (40 mg total) by mouth daily.   PROMETHAZINE-DEXTROMETHORPHAN (PROMETHAZINE-DM) 6.25-15 MG/5ML SYRUP    Take 5 mLs by mouth 3 (three) times daily as needed for cough.   PSEUDOEPHEDRINE (SUDAFED) 60 MG TABLET    Take 1 tablet (60 mg total) by mouth every 8 (eight) hours as needed for congestion.  Modified Medications   No medications on file   Discontinued Medications   No medications on file    Allergies: No Known Allergies  Past Medical History: Past Medical History:  Diagnosis Date   COVID-19 virus infection 01/01/2022   Mallory-Weiss tear    Open fracture of tooth 06/05/2022   Pneumonia     Social History: Social History   Socioeconomic History   Marital status: Single    Spouse name: Not on file   Number of children: Not on file   Years of education: Not on file   Highest education level: Not on file  Occupational History   Not on file  Tobacco Use   Smoking status: Former    Types: Cigars   Smokeless tobacco: Never  Vaping Use   Vaping status: Never Used  Substance and Sexual Activity   Alcohol use: Yes    Comment: ocassionally   Drug use: Yes    Types: Marijuana   Sexual activity: Yes    Partners: Female, Male  Other Topics Concern   Not on file  Social History Narrative   Not on file   Social Determinants of Health   Financial Resource Strain: Not on file  Food Insecurity: Not on file  Transportation Needs: Not on file  Physical Activity: Not on file  Stress: Not on file  Social  Connections: Not on file    Labs: Lab Results  Component Value Date   HIV1RNAQUANT Not Detected 12/04/2022   HIV1RNAQUANT Not Detected 07/24/2022   HIV1RNAQUANT Not Detected 03/15/2022   CD4TABS 463 07/24/2022   CD4TABS 817 01/01/2022   CD4TABS 581 08/31/2021    RPR and STI Lab Results  Component Value Date   LABRPR REACTIVE (A) 02/12/2023   LABRPR REACTIVE (A) 12/04/2022   LABRPR REACTIVE (A) 09/06/2022   LABRPR REACTIVE (A) 08/21/2022   LABRPR REACTIVE (A) 07/24/2022   RPRTITER 1:2 (H) 02/12/2023   RPRTITER 1:4 (H) 12/04/2022   RPRTITER 1:1 (H) 09/06/2022   RPRTITER 1:4 (H) 08/21/2022   RPRTITER 1:4 (H) 07/24/2022    STI Results GC CT  02/12/2023 10:02 AM Negative    Negative  Negative    Negative   12/04/2022  3:23 PM Negative    Negative    Negative  Positive    Negative     Negative   09/06/2022  1:46 PM Negative    Negative    Negative  Negative    Negative    Negative   08/21/2022 11:05 AM Negative  Negative   08/21/2022 10:12 AM Negative  Negative   07/24/2022 11:29 AM Negative    Negative  Negative    Negative   06/07/2022 10:54 AM Negative    Negative    Negative  Negative    Negative    Negative   05/16/2022 11:32 AM Negative    Negative  Negative    Negative   05/16/2022 11:21 AM Negative  Negative   04/30/2022 11:00 AM Positive    Negative    Negative  Negative    Negative    Negative   03/15/2022  2:55 PM Negative    Negative    Negative  Negative    Negative    Negative   11/16/2021 11:40 AM Negative    Negative    Negative  Negative    Negative    Negative   10/18/2021  3:30 PM Other    Negative  Other    Negative   08/31/2021  9:07 AM Negative    Positive  Negative    Negative   04/28/2021 11:01 AM Negative    Negative  Negative    Negative     Hepatitis B Lab Results  Component Value Date   HEPBSAB REACTIVE (A) 07/24/2022   HEPBSAG NON-REACTIVE 04/28/2021   HEPBCAB NON-REACTIVE 04/28/2021   Hepatitis C Lab Results  Component Value Date   HEPCAB NON-REACTIVE 04/28/2021   Hepatitis A Lab Results  Component Value Date   HAV REACTIVE (A) 04/28/2021   Lipids: Lab Results  Component Value Date   CHOL 182 07/24/2022   TRIG 67 07/24/2022   HDL 64 07/24/2022   CHOLHDL 2.8 07/24/2022   LDLCALC 103 (H) 07/24/2022    TARGET DATE:  The 24th of the month  Assessment: Toree presents today for their maintenance Cabenuva injections. Initial/past injections were tolerated well without issues. No problems with systemic effects of injections..   Administered cabotegravir 600mg /68mL in left upper outer quadrant of the gluteal muscle. Administered rilpivirine 900 mg/29mL in the right upper outer quadrant of the gluteal muscle. Monitored patient for 10 minutes after injection. Injections were tolerated well without  issue. Patient will follow up in 2 months for next injection. Will defer HIV RNA as it was last assessed in July; will check at next appointment.   Last STI testing completed in September;  everything returned negative. Has been active with new partners since then; will check urine/oral cytologies and RPR today. No concerning symptoms or known exposures to STIs. States [facility] claims to not have doxyPEP prescription; resent again today. Accepts condoms as well.  Due for 2nd MPX vaccine which was administered. Japeth declined flu and COVID vaccines today.  Plan: - Cabenuva injections administered - Check RPR and urine/oral cytologies  - Refill doxyPEP - Administer 2/2 MPX vaccine - Next injections scheduled for 12/18 with me and 2/18 with Dr. Luciana Axe  - Call with any issues or questions  Margarite Gouge, PharmD, CPP, BCIDP, AAHIVP Clinical Pharmacist Practitioner Infectious Diseases Clinical Pharmacist [facility]

## 2023-03-19 ENCOUNTER — Other Ambulatory Visit (HOSPITAL_COMMUNITY)
Admission: RE | Admit: 2023-03-19 | Discharge: 2023-03-19 | Disposition: A | Discharge status: Home / Self Care | Payer: Medicaid Other | Source: Ambulatory Visit | Attending: Internal Medicine | Admitting: Internal Medicine

## 2023-03-19 ENCOUNTER — Ambulatory Visit (INDEPENDENT_AMBULATORY_CARE_PROVIDER_SITE_OTHER): Payer: Medicaid Other | Admitting: Pharmacist

## 2023-03-19 ENCOUNTER — Other Ambulatory Visit: Payer: Self-pay

## 2023-03-19 DIAGNOSIS — Z113 Encounter for screening for infections with a predominantly sexual mode of transmission: Secondary | ICD-10-CM | POA: Diagnosis present

## 2023-03-19 DIAGNOSIS — Z23 Encounter for immunization: Secondary | ICD-10-CM | POA: Diagnosis not present

## 2023-03-19 DIAGNOSIS — B20 Human immunodeficiency virus [HIV] disease: Secondary | ICD-10-CM

## 2023-03-19 MED ORDER — CABOTEGRAVIR & RILPIVIRINE ER 600 & 900 MG/3ML IM SUER
1.0000 | Freq: Once | INTRAMUSCULAR | Status: AC
Start: 2023-03-19 — End: 2023-03-19
  Administered 2023-03-19: 1 via INTRAMUSCULAR

## 2023-03-19 MED ORDER — DOXYCYCLINE HYCLATE 100 MG PO TABS
ORAL_TABLET | ORAL | 2 refills | Status: DC
Start: 2023-03-19 — End: 2023-05-01

## 2023-03-20 LAB — CYTOLOGY, (ORAL, ANAL, URETHRAL) ANCILLARY ONLY
Chlamydia: NEGATIVE
Comment: NEGATIVE
Comment: NORMAL
Neisseria Gonorrhea: NEGATIVE

## 2023-03-20 LAB — URINE CYTOLOGY ANCILLARY ONLY
Chlamydia: NEGATIVE
Comment: NEGATIVE
Comment: NORMAL
Neisseria Gonorrhea: NEGATIVE

## 2023-03-21 LAB — T PALLIDUM AB: T Pallidum Abs: POSITIVE — AB

## 2023-03-21 LAB — RPR TITER: RPR Titer: 1:2 {titer} — ABNORMAL HIGH

## 2023-03-21 LAB — RPR: RPR Ser Ql: REACTIVE — AB

## 2023-04-24 ENCOUNTER — Telehealth: Payer: Self-pay

## 2023-04-24 NOTE — Telephone Encounter (Signed)
Patient called in regards to appt on 03/19/2023 and requesting to speak about the labs and testing that was completed at this appt. Could you possibly review and advise?   Confirmed contact info. Pt also scheduled another testing appt for 05/01/2023 as well.

## 2023-04-24 NOTE — Telephone Encounter (Signed)
Spoke with Annabelle Harman on the phone and reviewed that all labs look great. Will answer further questions next week. Thanks!

## 2023-04-30 ENCOUNTER — Ambulatory Visit: Payer: Medicaid Other | Admitting: Critical Care Medicine

## 2023-05-01 ENCOUNTER — Encounter: Payer: Self-pay | Admitting: Pharmacist

## 2023-05-01 ENCOUNTER — Other Ambulatory Visit: Payer: Self-pay

## 2023-05-01 ENCOUNTER — Other Ambulatory Visit: Payer: Self-pay | Admitting: Pharmacist

## 2023-05-01 ENCOUNTER — Other Ambulatory Visit (HOSPITAL_COMMUNITY)
Admission: RE | Admit: 2023-05-01 | Discharge: 2023-05-01 | Disposition: A | Discharge status: Home / Self Care | Payer: Medicaid Other | Source: Ambulatory Visit | Attending: Internal Medicine | Admitting: Internal Medicine

## 2023-05-01 ENCOUNTER — Telehealth: Payer: Self-pay | Admitting: Pharmacist

## 2023-05-01 ENCOUNTER — Ambulatory Visit (INDEPENDENT_AMBULATORY_CARE_PROVIDER_SITE_OTHER): Payer: Medicaid Other | Admitting: Pharmacist

## 2023-05-01 DIAGNOSIS — Z113 Encounter for screening for infections with a predominantly sexual mode of transmission: Secondary | ICD-10-CM | POA: Insufficient documentation

## 2023-05-01 DIAGNOSIS — F649 Gender identity disorder, unspecified: Secondary | ICD-10-CM

## 2023-05-01 MED ORDER — DOXYCYCLINE HYCLATE 100 MG PO TABS
ORAL_TABLET | ORAL | 2 refills | Status: DC
Start: 2023-05-01 — End: 2024-02-11

## 2023-05-01 MED ORDER — ESTRADIOL VALERATE 10 MG/ML IM OIL
10.0000 mg | TOPICAL_OIL | INTRAMUSCULAR | 25 refills | Status: DC
Start: 2023-05-01 — End: 2023-05-02

## 2023-05-01 NOTE — Progress Notes (Signed)
HPI: Joshua Leon. is a 27 y.o. adult who presents to the [facility] pharmacy clinic for STI testing.  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   CHLORHEXIDINE (PERIDEX) 0.12 % SOLUTION    Use as directed 15 mLs in the mouth or throat 2 (two) times daily.   DOXYCYCLINE (VIBRA-TABS) 100 MG TABLET    Take 1 tablet (100 mg total) by mouth 2 (two) times daily.   ESTRADIOL VALERATE 10 MG/ML OIL    Inject 1 mL (10 mg total) into the muscle every 14 (fourteen) days.   METHOCARBAMOL (ROBAXIN) 500 MG TABLET    Take 1 tablet (500 mg total) by mouth 2 (two) times daily.   NEEDLES & SYRINGES MISC    1 Units by Does not apply route every 14 (fourteen) days.   OMEPRAZOLE (PRILOSEC) 40 MG CAPSULE    Take 1 capsule (40 mg total) by mouth daily. Take 1 capsule 30 minutes prior to breakfast meal each day.   PANTOPRAZOLE (PROTONIX) 40 MG TABLET    Take 1 tablet (40 mg total) by mouth daily.   PROMETHAZINE-DEXTROMETHORPHAN (PROMETHAZINE-DM) 6.25-15 MG/5ML SYRUP    Take 5 mLs by mouth 3 (three) times daily as needed for cough.   PSEUDOEPHEDRINE (SUDAFED) 60 MG TABLET    Take 1 tablet (60 mg total) by mouth every 8 (eight) hours as needed for congestion.  Modified Medications   Modified Medication Previous Medication   DOXYCYCLINE (VIBRA-TABS) 100 MG TABLET doxycycline (VIBRA-TABS) 100 MG tablet      Take two tablets (200mg ) by mouth as  needed within 72 hours after unprotected sex.    Take two tablets (200mg ) by mouth as needed within 72 hours after unprotected sex.  Discontinued Medications   No medications on file    Allergies: No Known Allergies  Past Medical History: Past Medical History:  Diagnosis Date   COVID-19 virus infection 01/01/2022   Mallory-Weiss tear    Open fracture of tooth 06/05/2022   Pneumonia     Social History: Social History   Socioeconomic History   Marital status: Single    Spouse name: Not on file   Number of children: Not on file   Years of education: Not on file   Highest education level: Not on file  Occupational History   Not on file  Tobacco Use   Smoking status: Former    Types: Cigars   Smokeless tobacco: Never  Vaping Use   Vaping status: Never Used  Substance and Sexual Activity   Alcohol use: Yes    Comment: ocassionally   Drug use: Yes    Types: Marijuana   Sexual activity: Yes    Partners: Female, Male  Other Topics Concern   Not on file  Social History Narrative   Not on file   Social Determinants of Health   Financial Resource Strain: Not on file  Food Insecurity: Not on file  Transportation Needs: Not on file  Physical Activity: Not on file  Stress: Not on file  Social Connections: Not on file    Labs: Lab Results  Component Value Date   HIV1RNAQUANT Not Detected 12/04/2022   HIV1RNAQUANT Not Detected 07/24/2022   HIV1RNAQUANT Not Detected 03/15/2022   CD4TABS 463 07/24/2022   CD4TABS 817 01/01/2022   CD4TABS 581 08/31/2021    RPR and STI Lab Results  Component Value Date   LABRPR REACTIVE (A) 03/19/2023   LABRPR REACTIVE (A) 02/12/2023   LABRPR REACTIVE (A) 12/04/2022   LABRPR REACTIVE (A) 09/06/2022   LABRPR REACTIVE (A) 08/21/2022   RPRTITER 1:2 (H) 03/19/2023   RPRTITER 1:2 (H) 02/12/2023   RPRTITER 1:4 (H) 12/04/2022   RPRTITER 1:1 (H) 09/06/2022   RPRTITER 1:4 (H) 08/21/2022    STI Results GC CT  03/19/2023 10:10 AM  Negative    Negative  Negative    Negative   02/12/2023 10:02 AM Negative    Negative  Negative    Negative   12/04/2022  3:23 PM Negative    Negative    Negative  Positive    Negative    Negative   09/06/2022  1:46 PM Negative    Negative    Negative  Negative    Negative    Negative   08/21/2022 11:05 AM Negative  Negative   08/21/2022 10:12 AM Negative  Negative   07/24/2022 11:29 AM Negative    Negative  Negative    Negative   06/07/2022 10:54 AM Negative    Negative    Negative  Negative    Negative    Negative   05/16/2022 11:32 AM Negative    Negative  Negative    Negative   05/16/2022 11:21 AM Negative  Negative   04/30/2022 11:00 AM Positive    Negative    Negative  Negative    Negative    Negative   03/15/2022  2:55 PM Negative    Negative    Negative  Negative    Negative    Negative   11/16/2021 11:40 AM Negative    Negative    Negative  Negative    Negative    Negative   10/18/2021  3:30 PM Other    Negative  Other    Negative   08/31/2021  9:07 AM Negative    Positive  Negative    Negative   04/28/2021 11:01 AM Negative    Negative  Negative    Negative     Hepatitis B Lab Results  Component Value Date   HEPBSAB REACTIVE (A) 07/24/2022   HEPBSAG NON-REACTIVE 04/28/2021   HEPBCAB NON-REACTIVE 04/28/2021   Hepatitis C Lab Results  Component Value Date   HEPCAB NON-REACTIVE 04/28/2021   Hepatitis A Lab Results  Component Value Date   HAV REACTIVE (A) 04/28/2021   Lipids: Lab Results  Component Value Date   CHOL 182 07/24/2022   TRIG 67 07/24/2022   HDL 64 07/24/2022   CHOLHDL 2.8 07/24/2022   LDLCALC 103 (H) 07/24/2022    Assessment: Joshua Leon presents to clinic today for STI testing. She denies any current symptoms or exposures but simply requests routine screening. Will check urine/oral/rectal cytologies today and will defer RPR until Joshua Leon follow up with me in 2 weeks. She states she uses condoms during all sexual encounters  but they break occasionally. Encouraged Joshua Leon to pick up Joshua Leon doxyPEP for this scenario. She seems to keep running  into issues filling it. Resent it [facility] to [facility] [address]; she said she would pick up today. Will follow up about this fill at Joshua Leon next visit.   Also requested refills for estrogen injections today. I do not see recent fill history, so sent message to Dr. Luciana Axe about refills.  Plan: - Check urine/oral/rectal cytologies - Follow up on results as needed - Refill [facility] to [facility] - Follow up on estrogen injection refills  - Follow up with me on 12/18 for Cabenuva injection   Margarite Gouge, PharmD, CPP, BCIDP, AAHIVP Clinical Pharmacist Practitioner Infectious Diseases Clinical Pharmacist [facility] 05/01/2023, 3:30 PM

## 2023-05-01 NOTE — Telephone Encounter (Signed)
During STI visit today, Challis asked about estrogen injection refills. Based on my review, she has not filled these since last August. She said she started on them in October 2022 and has not missed any. Please let me know if appropriate to send refills in - thanks!  Margarite Gouge, PharmD, CPP, BCIDP, AAHIVP Clinical Pharmacist Practitioner Infectious Diseases Clinical Pharmacist [facility]

## 2023-05-02 MED ORDER — ESTRADIOL VALERATE 10 MG/ML IM OIL: 5.0000 mg | TOPICAL_OIL | INTRAMUSCULAR | 0 refills | Status: DC

## 2023-05-02 NOTE — Progress Notes (Signed)
Received message from Tiffany that pharmacy stated we must fill 5 mg of 10 mg estradiol per Medicaid requirements. Will update patient on this as well.  Margarite Gouge, PharmD, CPP, BCIDP, AAHIVP Clinical Pharmacist Practitioner Infectious Diseases Clinical Pharmacist [facility]

## 2023-05-02 NOTE — Addendum Note (Signed)
Addended by: Jennette Kettle on: 05/02/2023 01:16 PM   Modules accepted: Orders

## 2023-05-03 LAB — CYTOLOGY, (ORAL, ANAL, URETHRAL) ANCILLARY ONLY
Chlamydia: NEGATIVE
Chlamydia: NEGATIVE
Comment: NEGATIVE
Comment: NEGATIVE
Comment: NORMAL
Comment: NORMAL
Neisseria Gonorrhea: NEGATIVE
Neisseria Gonorrhea: NEGATIVE

## 2023-05-03 LAB — URINE CYTOLOGY ANCILLARY ONLY
Chlamydia: NEGATIVE
Comment: NEGATIVE
Comment: NORMAL
Neisseria Gonorrhea: NEGATIVE

## 2023-05-04 NOTE — Progress Notes (Unsigned)
[  New Patient Office Visit  Subjective    Patient ID: Joshua Leon., adult    DOB: 26-Aug-1995  Age: 27 y.o. MRN: [id number]  CC:  Chief Complaint  Patient presents with   Medical Management of Chronic Issues   Medication Refill    HPI 01/31/22  Joshua Leon. presents to establish care This is a 27 year old male gender identified at birth now transitioning to male.  Patient has history of HIV diagnosed in December 2022.  Followed closely by the HIV clinic is on every 2 months injectable therapy.  Patient was just hospitalized August with COVID infection manifested by severe gastrointestinal inflammation and incessant nausea and vomiting with Mallory-Weiss tear and upper GI bleed with anemia.  Patient needs follow-up as an outpatient with gastroenterology this is yet to be achieved.  Patient does not smoke tobacco products but does use marijuana.  Her CD4 counts have been good on therapy.  She was starting now estrogen therapy to transition to male gender identification.  The management of the estrogen therapy has been under HIV infectious disease clinic.  On arrival blood pressure 123/70 and below is documentation from the discharge summary Admit date:     01/01/2022  Discharge date: 01/03/22  Discharge Physician: Joshua Leon   Recommendations at discharge:    Follow up with ID for further care HIV.      Discharge Diagnoses: Principal Problem:   GIB (gastrointestinal bleeding) Active Problems:   Mallory-Weiss tear   Human immunodeficiency virus (HIV) disease (HCC)   COVID-19 virus infection   Resolved Problems:   * No resolved hospital problems. *   Hospital Course: 27 year old with past medical history significant for HIV presents complaining of dark stool, nausea and vomiting.  She states that the morning of admission on her way to work she developed nausea followed by episode of vomiting with a small amount of blood.  Patient was found to be  positive for COVID.  Chest x-ray no acute abnormality. He was evaluated by GI and underwent endoscopy on 80/80/2023.  10 mm nonbleeding Mallory-Weiss tear noted treated with 4 clips.  2 cm hiatal hernia also noted.  Patient was a started on PPI for 1 months per GI recommendation. Started on Paxlovid for COVID 19 virus infection.  Appears to be asymptomatic from Respiratory symptoms currently.   Assessment and Plan: 1-GI bleed, secondary to Mallory-Weiss tear Presented with nausea vomiting, hematemesis.  Hemoglobin on admission at 12 from a baseline of 16.  Subsequently hemoglobin has remained stable around 9. He was started on PPI, will provide refill for 1 month. He underwent endoscopy 80/80/2023.  10 mm nonbleeding Mallory-Weiss tear noted treated with 4 clips.   2-COVID-19 virus infection: Screening positive on admission.  GI symptoms may be associated.  He does not have any respiratory symptoms.  He was started on Paxlovid.  He will complete treatment.   3-human immunodeficiency virus Thank you 2 months Cabenuva.  Last CD4 581.  Continue to follow-up with Joshua Leon           Consultants: Joshua Leon  Procedures performed: Endoscopy  Below is documentation for the last infectious disease visit ID OV Joshua Leon Doing well on Cabenuva and dose given today.  No changes and has follow up in October scheduled.  Labs done earlier this month.   I discussed hormone therapy and will start with 10 mg of estradiol IM and will schedule a nurse visit.  Discussed it will be every  2 weeks.  Follow up in 2 months.  Can consider a dose increase and spironalactone, as appropriate.    The patient has no other complaints  06/05/22   Patient seen in return follow-up today complains of a cracked left lower molar.  Also still having chest pain difficulty swallowing.  She had a Mallory-Weiss tear this past fall was seen by Joshua. Elnoria Leon however the practice declines to see her as outpatient follow-up because she  has Medicaid.  The [facility] practice also declined to see her as well because she had seen the other gastroenterology practice in the hospital.  Another issue is that she would like STD screening.  She is on HIV therapy and is compliant.  11/01/22 This patient is seen in return for follow-up  The patient follows with infectious disease HIV clinic.  She has transition from male to male.  Patient is still injecting estrogen therapy for this transition.  Patient had a fractured tooth when we last saw her in January of this year.  She went to the dental clinic sponsored by the HIV clinic and had this tooth removed.  She had a Mallory-Weiss tear in the past she saw gastroenterology they declined to do an endoscopy wanted to be on pantoprazole however prescription was never sent.  She still having reflux symptoms.  There are no other complaints.  Labs are monitored by infectious disease.  05/07/23 This is a pleasant 27 year old who identifies with gender male was male at birth she is currently continuing estrogen therapy for her gender changes.  She has history of HIV on injectable antiviral for which she needs a refill and more syringes.  She also needs a refill on her estrogen therapy.  She is followed closely in infectious disease clinic recent STD testing was negative.  She has already had the flu vaccine.  There are no other complaints There was a history of Mallory-Weiss tear she had follow-up with gastroenterology there is no further evidence of tear esophagus has healed. Outpatient Encounter Medications as of 05/07/2023  Medication Sig   doxycycline (VIBRA-TABS) 100 MG tablet Take 1 tablet (100 mg total) by mouth 2 (two) times daily.   doxycycline (VIBRA-TABS) 100 MG tablet Take two tablets (200mg ) by mouth as needed within 72 hours after unprotected sex.   CABENUVA 600 & 900 MG/3ML injection INJECT 1 KIT INTO THE MUSCLE EVERY 2 MONTHS   cetirizine (ZYRTEC ALLERGY) 10 MG tablet Take 1 tablet (10  mg total) by mouth daily.   chlorhexidine (PERIDEX) 0.12 % solution Use as directed 15 mLs in the mouth or throat 2 (two) times daily.   Estradiol Valerate 10 MG/ML OIL Inject 0.5 mLs (5 mg total) into the muscle every 14 (fourteen) days.   methocarbamol (ROBAXIN) 500 MG tablet Take 1 tablet (500 mg total) by mouth 2 (two) times daily.   Needles & Syringes MISC 1 Units by Does not apply route every 14 (fourteen) days.   omeprazole (PRILOSEC) 40 MG capsule Take 1 capsule (40 mg total) by mouth daily. Take 1 capsule 30 minutes prior to breakfast meal each day.   pantoprazole (PROTONIX) 40 MG tablet Take 1 tablet (40 mg total) by mouth daily.   promethazine-dextromethorphan (PROMETHAZINE-DM) 6.25-15 MG/5ML syrup Take 5 mLs by mouth 3 (three) times daily as needed for cough.   pseudoephedrine (SUDAFED) 60 MG tablet Take 1 tablet (60 mg total) by mouth every 8 (eight) hours as needed for congestion.   [DISCONTINUED] Estradiol Valerate 10 MG/ML OIL Inject 0.5  mLs (5 mg total) into the muscle every 14 (fourteen) days.   [DISCONTINUED] methocarbamol (ROBAXIN) 500 MG tablet Take 1 tablet (500 mg total) by mouth 2 (two) times daily.   [DISCONTINUED] Needles & Syringes MISC 1 Units by Does not apply route every 14 (fourteen) days.   [DISCONTINUED] pantoprazole (PROTONIX) 40 MG tablet Take 1 tablet (40 mg total) by mouth daily.   No facility-administered encounter medications on file as of 05/07/2023.    Past Medical History:  Diagnosis Date   COVID-19 virus infection 01/01/2022   Mallory-Weiss tear    Open fracture of tooth 06/05/2022   Pneumonia     Past Surgical History:  Procedure Laterality Date   ESOPHAGOGASTRODUODENOSCOPY (EGD) WITH PROPOFOL N/A 01/02/2022   Procedure: ESOPHAGOGASTRODUODENOSCOPY (EGD) WITH PROPOFOL;  Surgeon: Jeani Hawking, MD;  Location: [facility];  Service: Gastroenterology;  Laterality: N/A;  Hematememesis, anemia, blood in stool.I   HEMOSTASIS CLIP PLACEMENT  01/02/2022    Procedure: HEMOSTASIS CLIP PLACEMENT;  Surgeon: Jeani Hawking, MD;  Location: [facility];  Service: Gastroenterology;;    Family History  Problem Relation Age of Onset   Graves' disease Mother    Thyroid disease Mother    Sarcoidosis Maternal Aunt    Hypertension Maternal Grandmother    Cancer Neg Hx    Heart failure Neg Hx    Hyperlipidemia Neg Hx    Stomach cancer Neg Hx    Pancreatic cancer Neg Hx    Esophageal cancer Neg Hx    Colon cancer Neg Hx    Colon polyps Neg Hx     Social History   Socioeconomic History   Marital status: Single    Spouse name: Not on file   Number of children: Not on file   Years of education: Not on file   Highest education level: Not on file  Occupational History   Not on file  Tobacco Use   Smoking status: Former    Types: Cigars   Smokeless tobacco: Never  Vaping Use   Vaping status: Never Used  Substance and Sexual Activity   Alcohol use: Yes    Comment: ocassionally   Drug use: Yes    Types: Marijuana   Sexual activity: Yes    Partners: Female, Male  Other Topics Concern   Not on file  Social History Narrative   Not on file   Social Determinants of Health   Financial Resource Strain: Medium Risk (05/07/2023)   Overall Financial Resource Strain (CARDIA)    Difficulty of Paying Living Expenses: Somewhat hard  Food Insecurity: Food Insecurity Present (05/07/2023)   Hunger Vital Sign    Worried About Running Out of Food in the Last Year: Never true    Ran Out of Food in the Last Year: Sometimes true  Transportation Needs: No Transportation Needs (05/07/2023)   PRAPARE - Administrator, Civil Service (Medical): No    Lack of Transportation (Non-Medical): No  Physical Activity: Not on file  Stress: No Stress Concern Present (05/07/2023)   [employer] of Occupational Health - Occupational Stress Questionnaire    Feeling of Stress : Not at all  Social Connections: Unknown (05/07/2023)   Social Connection  and Isolation Panel [NHANES]    Frequency of Communication with Friends and Family: Once a week    Frequency of Social Gatherings with Friends and Family: Three times a week    Attends Religious Services: Never    Active Member of Clubs or Organizations: No    Attends  Club or Organization Meetings: Never    Marital Status: Not on file  Intimate Partner Violence: Not on file    Review of Systems  Constitutional:  Negative for chills, diaphoresis, fever, malaise/fatigue and weight loss.  HENT:  Negative for congestion, hearing loss, nosebleeds, sore throat and tinnitus.        Fractured left lower molar  Eyes:  Negative for blurred vision, photophobia and redness.  Respiratory:  Negative for cough, hemoptysis, sputum production, shortness of breath, wheezing and stridor.   Cardiovascular:  Negative for chest pain, palpitations, orthopnea, claudication, leg swelling and PND.  Gastrointestinal:  Negative for abdominal pain, blood in stool, constipation, diarrhea, heartburn, nausea and vomiting.  Genitourinary:  Negative for dysuria, flank pain, frequency, hematuria and urgency.  Musculoskeletal:  Negative for back pain, falls, joint pain, myalgias and neck pain.  Skin:  Negative for itching and rash.  Neurological:  Negative for dizziness, tingling, tremors, sensory change, speech change, focal weakness, seizures, loss of consciousness, weakness and headaches.  Endo/Heme/Allergies:  Negative for environmental allergies and polydipsia. Does not bruise/bleed easily.  Psychiatric/Behavioral:  Negative for depression, memory loss, substance abuse and suicidal ideas. The patient is not nervous/anxious and does not have insomnia.         Objective    BP 133/71 (BP Location: Right Arm, Patient Position: Sitting, Cuff Size: Normal)   Pulse (!) 50   Wt 207 lb 3.2 oz (94 kg)   SpO2 100%   BMI 30.60 kg/m   Physical Exam Vitals reviewed.  Constitutional:      Appearance: Normal appearance.  She is well-developed. She is not diaphoretic.  HENT:     Head: Normocephalic and atraumatic.     Nose: No nasal deformity, septal deviation, mucosal edema or rhinorrhea.     Right Sinus: No maxillary sinus tenderness or frontal sinus tenderness.     Left Sinus: No maxillary sinus tenderness or frontal sinus tenderness.     Mouth/Throat:     Pharynx: No oropharyngeal exudate.     Comments: Fractured left lower molar with dental caries Eyes:     General: No scleral icterus.    Conjunctiva/sclera: Conjunctivae normal.     Pupils: Pupils are equal, round, and reactive to light.  Neck:     Thyroid: No thyromegaly.     Vascular: No carotid bruit or JVD.     Trachea: Trachea normal. No tracheal tenderness or tracheal deviation.  Cardiovascular:     Rate and Rhythm: Normal rate and regular rhythm.     Chest Wall: PMI is not displaced.     Pulses: Normal pulses. No decreased pulses.     Heart sounds: Normal heart sounds, S1 normal and S2 normal. Heart sounds not distant. No murmur heard.    No systolic murmur is present.     No diastolic murmur is present.     No friction rub. No gallop. No S3 or S4 sounds.  Pulmonary:     Effort: No tachypnea, accessory muscle usage or respiratory distress.     Breath sounds: No stridor. No decreased breath sounds, wheezing, rhonchi or rales.  Chest:     Chest wall: No tenderness.  Abdominal:     General: Bowel sounds are normal. There is no distension.     Palpations: Abdomen is soft. Abdomen is not rigid.     Tenderness: There is no abdominal tenderness. There is no guarding or rebound.  Musculoskeletal:        General: Normal range of  motion.     Cervical back: Normal range of motion and neck supple. No edema, erythema or rigidity. No muscular tenderness. Normal range of motion.  Lymphadenopathy:     Head:     Right side of head: No submental or submandibular adenopathy.     Left side of head: No submental or submandibular adenopathy.      Cervical: No cervical adenopathy.  Skin:    General: Skin is warm and dry.     Coloration: Skin is not pale.     Findings: No rash.     Nails: There is no clubbing.  Neurological:     Mental Status: She is alert and oriented to person, place, and time.     Sensory: No sensory deficit.  Psychiatric:        Speech: Speech normal.        Behavior: Behavior normal.         Assessment & Plan:   Problem List Items Addressed This Visit       Digestive   Dental caries    Follows with dental clinic and infectious disease office      RESOLVED: Mallory-Weiss tear    Resolved        Other   Human immunodeficiency virus (HIV) disease (HCC)    Management per infectious disease      Gender identity disorder, unspecified   Relevant Medications   Estradiol Valerate 10 MG/ML OIL   Other Visit Diagnoses     Encounter for screening involving social determinants of health (SDoH)    -  Primary   Relevant Orders   AMB Referral [facility]BCI Care Management      Return in about 6 months (around 11/05/2023) for primary care follow up.   Shan Levans, MD

## 2023-05-06 ENCOUNTER — Other Ambulatory Visit (HOSPITAL_COMMUNITY): Payer: Self-pay

## 2023-05-06 ENCOUNTER — Telehealth: Payer: Self-pay

## 2023-05-06 NOTE — Telephone Encounter (Signed)
PA for doxy PEP initiated via covermymeds. Awaiting response.   [id number]  Sandie Ano, RN

## 2023-05-06 NOTE — Telephone Encounter (Signed)
PA approved 05/06/23-05/05/24.  Approval faxed to pharmacy.  Sandie Ano, RN

## 2023-05-07 ENCOUNTER — Ambulatory Visit: Payer: Medicaid Other | Attending: Critical Care Medicine | Admitting: Critical Care Medicine

## 2023-05-07 ENCOUNTER — Encounter: Payer: Self-pay | Admitting: Critical Care Medicine

## 2023-05-07 VITALS — BP 133/71 | HR 50 | Wt 207.2 lb

## 2023-05-07 DIAGNOSIS — B20 Human immunodeficiency virus [HIV] disease: Secondary | ICD-10-CM | POA: Diagnosis not present

## 2023-05-07 DIAGNOSIS — K029 Dental caries, unspecified: Secondary | ICD-10-CM | POA: Diagnosis not present

## 2023-05-07 DIAGNOSIS — Z139 Encounter for screening, unspecified: Secondary | ICD-10-CM

## 2023-05-07 DIAGNOSIS — F649 Gender identity disorder, unspecified: Secondary | ICD-10-CM | POA: Diagnosis not present

## 2023-05-07 DIAGNOSIS — K226 Gastro-esophageal laceration-hemorrhage syndrome: Secondary | ICD-10-CM

## 2023-05-07 MED ORDER — METHOCARBAMOL 500 MG PO TABS: 500.0000 mg | ORAL_TABLET | Freq: Two times a day (BID) | ORAL | 0 refills | Status: DC

## 2023-05-07 MED ORDER — PANTOPRAZOLE SODIUM 40 MG PO TBEC: 40.0000 mg | DELAYED_RELEASE_TABLET | Freq: Every day | ORAL | 3 refills | Status: DC

## 2023-05-07 MED ORDER — NEEDLES & SYRINGES MISC: 1.0000 [IU] | 11 refills | Status: DC

## 2023-05-07 MED ORDER — ESTRADIOL VALERATE 10 MG/ML IM OIL
5.0000 mg | TOPICAL_OIL | INTRAMUSCULAR | 0 refills | Status: DC
Start: 2023-05-07 — End: 2023-12-10

## 2023-05-07 NOTE — Assessment & Plan Note (Signed)
Resolved

## 2023-05-07 NOTE — Patient Instructions (Signed)
Medications refilled  Go to infectious disease downstairs and they can give you your injection for HIV medication Refills on syringes sent to your pharmacy Your refill on your injection estrogen sent to [facility] Return 6 months

## 2023-05-07 NOTE — Assessment & Plan Note (Signed)
Management per infectious disease.

## 2023-05-07 NOTE — Assessment & Plan Note (Signed)
Follows with dental clinic and infectious disease office

## 2023-05-08 ENCOUNTER — Telehealth: Payer: Self-pay

## 2023-05-08 NOTE — Telephone Encounter (Signed)
[facility] Patient Advocate Encounter  Patient's medications CABENUVA have been couriered to [facility] from [facility] and will be administered at the patients appointment on 05/15/23.  Kae Heller, CPhT Specialty Pharmacy Patient [facility] for Infectious Disease Phone: [phone] Fax:  [phone]

## 2023-05-09 ENCOUNTER — Telehealth: Payer: Self-pay | Admitting: *Deleted

## 2023-05-09 NOTE — Progress Notes (Signed)
  Care Coordination   Note   05/09/2023 Name: Joshua Leon. MRN: [id number] DOB: [birth year]  Joshua Leon. is a 27 y.o. year old adult who sees Storm Frisk, MD for primary care. I reached out to [employer]. by phone today to offer care coordination services.  Ms. Callands was given information about Care Coordination services today including:   The Care Coordination services include support from the care team which includes your Nurse Coordinator, Clinical Social Worker, or Pharmacist.  The Care Coordination team is here to help remove barriers to the health concerns and goals most important to you. Care Coordination services are voluntary, and the patient may decline or stop services at any time by request to their care team member.   Care Coordination Consent Status: Patient agreed to services and verbal consent obtained.   Follow up plan:  Telephone appointment with care coordination team member scheduled for:  05/13/23  Encounter Outcome:  Patient Scheduled  [facility] Coordination Care Guide  Direct Dial: [phone]

## 2023-05-13 ENCOUNTER — Ambulatory Visit: Payer: Self-pay | Admitting: Licensed Clinical Social Worker

## 2023-05-13 NOTE — Patient Instructions (Signed)
Visit Information  Thank you for taking time to visit with me today. Please don't hesitate to contact me if I can be of assistance to you.   Following are the goals we discussed today:   Goals Addressed             This Visit's Progress    Patient Stated she is looking for new residence. She may have some financial challenges       Interventions:  Spoke with client about client needs.  Client said she recently had PCP appointment with Dr. Shan Levans. She did not mention any other medical providers she is seeing at present Discussed program support with RN, LCSW, Pharmacist Discussed medication procurement Discussed transport needs. She drives vehicle as needed to appointments, to her job and to complete community errands Discussed vision of client Client said she has Medicaid for insurance. Discussed family support. She said she has decreased family support Discussed mood of client. She said she thought her mood was stable at present Discussed housing. She said she is staying with a friend at present; she said she is looking for a new apartment where she can reside Discussed dental needs. She said she has had a tooth extracted recently.  She is still searching for a dentist she can see on a regular basis for dental care Used Active Listening skills with client to allow client to share her needs Provided some counseling support for client Provided Cliffard Shintani name and phone number and encouraged her to call LCSW as needed for SW support           Our next appointment is by telephone on 07/15/23 at 9:30 AM   Please call the care guide team at [phone] if you need to cancel or reschedule your appointment.   If you are experiencing a Mental Health or Behavioral Health Crisis or need someone to talk to, please go to [facility] [address] [place] [phone])   The patient verbalized understanding of instructions, educational  materials, and care plan provided today and DECLINED offer to receive copy of patient instructions, educational materials, and care plan.   The patient has been provided with contact information for the care management team and has been advised to call with any health related questions or concerns.   Kelton Pillar.Elijahjames [employer] MSW, LCSW Licensed Visual merchandiser [facility] Care Management [phone]

## 2023-05-13 NOTE — Progress Notes (Deleted)
HPI: Joshua Avram. is a 27 y.o. adult who presents to the [facility] pharmacy clinic for [place] administration.  Patient Active Problem List   Diagnosis Date Noted   Screen for STD (sexually transmitted disease) 06/05/2022   Dental caries 06/05/2022   Syphilis 04/30/2022   Gender identity disorder, unspecified 01/17/2022   Medication monitoring encounter 05/24/2021   Human immunodeficiency virus (HIV) disease (HCC) 04/28/2021    Patient's Medications  New Prescriptions   No medications on file  Previous Medications   CABENUVA 600 & 900 MG/3ML INJECTION    INJECT 1 KIT INTO THE MUSCLE EVERY 2 MONTHS   CETIRIZINE (ZYRTEC ALLERGY) 10 MG TABLET    Take 1 tablet (10 mg total) by mouth daily.   CHLORHEXIDINE (PERIDEX) 0.12 % SOLUTION    Use as directed 15 mLs in the mouth or throat 2 (two) times daily.   DOXYCYCLINE (VIBRA-TABS) 100 MG TABLET    Take 1 tablet (100 mg total) by mouth 2 (two) times daily.   DOXYCYCLINE (VIBRA-TABS) 100 MG TABLET    Take two tablets (200mg ) by mouth as needed within 72 hours after unprotected sex.   ESTRADIOL VALERATE 10 MG/ML OIL    Inject 0.5 mLs (5 mg total) into the muscle every 14 (fourteen) days.   METHOCARBAMOL (ROBAXIN) 500 MG TABLET    Take 1 tablet (500 mg total) by mouth 2 (two) times daily.   NEEDLES & SYRINGES MISC    1 Units by Does not apply route every 14 (fourteen) days.   OMEPRAZOLE (PRILOSEC) 40 MG CAPSULE    Take 1 capsule (40 mg total) by mouth daily. Take 1 capsule 30 minutes prior to breakfast meal each day.   PANTOPRAZOLE (PROTONIX) 40 MG TABLET    Take 1 tablet (40 mg total) by mouth daily.   PROMETHAZINE-DEXTROMETHORPHAN (PROMETHAZINE-DM) 6.25-15 MG/5ML SYRUP    Take 5 mLs by mouth 3 (three) times daily as needed for cough.   PSEUDOEPHEDRINE (SUDAFED) 60 MG TABLET    Take 1 tablet (60 mg total) by mouth every 8 (eight) hours as needed for congestion.  Modified Medications   No medications on file  Discontinued Medications   No  medications on file    Allergies: No Known Allergies  Past Medical History: Past Medical History:  Diagnosis Date   COVID-19 virus infection 01/01/2022   Mallory-Weiss tear    Open fracture of tooth 06/05/2022   Pneumonia     Social History: Social History   Socioeconomic History   Marital status: Single    Spouse name: Not on file   Number of children: Not on file   Years of education: Not on file   Highest education level: Not on file  Occupational History   Not on file  Tobacco Use   Smoking status: Former    Types: Cigars   Smokeless tobacco: Never  Vaping Use   Vaping status: Never Used  Substance and Sexual Activity   Alcohol use: Yes    Comment: ocassionally   Drug use: Yes    Types: Marijuana   Sexual activity: Yes    Partners: Female, Male  Other Topics Concern   Not on file  Social History Narrative   Not on file   Social Drivers of Health   Financial Resource Strain: Medium Risk (05/07/2023)   Overall Financial Resource Strain (CARDIA)    Difficulty of Paying Living Expenses: Somewhat hard  Food Insecurity: Food Insecurity Present (05/07/2023)   Hunger Vital Sign  Worried About Programme researcher, broadcasting/film/video in the Last Year: Never true    Ran Out of Food in the Last Year: Sometimes true  Transportation Needs: No Transportation Needs (05/07/2023)   PRAPARE - Administrator, Civil Service (Medical): No    Lack of Transportation (Non-Medical): No  Physical Activity: Not on file  Stress: No Stress Concern Present (05/07/2023)   [employer] of Occupational Health - Occupational Stress Questionnaire    Feeling of Stress : Not at all  Social Connections: Unknown (05/07/2023)   Social Connection and Isolation Panel [NHANES]    Frequency of Communication with Friends and Family: Once a week    Frequency of Social Gatherings with Friends and Family: Three times a week    Attends Religious Services: Never    Active Member of Clubs or  Organizations: No    Attends Banker Meetings: Never    Marital Status: Not on file    Labs: Lab Results  Component Value Date   HIV1RNAQUANT Not Detected 12/04/2022   HIV1RNAQUANT Not Detected 07/24/2022   HIV1RNAQUANT Not Detected 03/15/2022   CD4TABS 463 07/24/2022   CD4TABS 817 01/01/2022   CD4TABS 581 08/31/2021    RPR and STI Lab Results  Component Value Date   LABRPR REACTIVE (A) 03/19/2023   LABRPR REACTIVE (A) 02/12/2023   LABRPR REACTIVE (A) 12/04/2022   LABRPR REACTIVE (A) 09/06/2022   LABRPR REACTIVE (A) 08/21/2022   RPRTITER 1:2 (H) 03/19/2023   RPRTITER 1:2 (H) 02/12/2023   RPRTITER 1:4 (H) 12/04/2022   RPRTITER 1:1 (H) 09/06/2022   RPRTITER 1:4 (H) 08/21/2022    STI Results GC CT  05/01/2023  3:08 PM Negative    Negative    Negative  Negative    Negative    Negative   03/19/2023 10:10 AM Negative    Negative  Negative    Negative   02/12/2023 10:02 AM Negative    Negative  Negative    Negative   12/04/2022  3:23 PM Negative    Negative    Negative  Positive    Negative    Negative   09/06/2022  1:46 PM Negative    Negative    Negative  Negative    Negative    Negative   08/21/2022 11:05 AM Negative  Negative   08/21/2022 10:12 AM Negative  Negative   07/24/2022 11:29 AM Negative    Negative  Negative    Negative   06/07/2022 10:54 AM Negative    Negative    Negative  Negative    Negative    Negative   05/16/2022 11:32 AM Negative    Negative  Negative    Negative   05/16/2022 11:21 AM Negative  Negative   04/30/2022 11:00 AM Positive    Negative    Negative  Negative    Negative    Negative   03/15/2022  2:55 PM Negative    Negative    Negative  Negative    Negative    Negative   11/16/2021 11:40 AM Negative    Negative    Negative  Negative    Negative    Negative   10/18/2021  3:30 PM Other    Negative  Other    Negative   08/31/2021  9:07 AM Negative    Positive  Negative    Negative    04/28/2021 11:01 AM Negative    Negative  Negative    Negative     Hepatitis  B Lab Results  Component Value Date   HEPBSAB REACTIVE (A) 07/24/2022   HEPBSAG NON-REACTIVE 04/28/2021   HEPBCAB NON-REACTIVE 04/28/2021   Hepatitis C Lab Results  Component Value Date   HEPCAB NON-REACTIVE 04/28/2021   Hepatitis A Lab Results  Component Value Date   HAV REACTIVE (A) 04/28/2021   Lipids: Lab Results  Component Value Date   CHOL 182 07/24/2022   TRIG 67 07/24/2022   HDL 64 07/24/2022   CHOLHDL 2.8 07/24/2022   LDLCALC 103 (H) 07/24/2022    TARGET DATE:  The 24th of the month  Assessment: Rigby presents today for their maintenance Cabenuva injections. Initial/past injections were tolerated well without issues. No problems with systemic effects of injections. Patient did experience ***.   Administered cabotegravir 600mg /14mL in left upper outer quadrant of the gluteal muscle. Administered rilpivirine 900 mg/60mL in the right upper outer quadrant of the gluteal muscle. Monitored patient for 10 minutes after injection. Injections were tolerated well without issue. Patient will follow up in 2 months for next injection. Will check HIV RNA today along with ***.  Plan: - Cabenuva injections administered - Check HIV RNA and *** - Next injections scheduled for 2/18 with Dr. Luciana Axe and *** with me  - Call with any issues or questions  Margarite Gouge, PharmD, CPP, BCIDP, AAHIVP Clinical Pharmacist Practitioner Infectious Diseases Clinical Pharmacist [facility]

## 2023-05-13 NOTE — Patient Outreach (Signed)
  Care Coordination   Initial Visit Note   05/13/2023 Name: Joshua Leon. MRN: [id number] DOB: Aug 03, 1995  Joshua Leon. is a 27 y.o. year old adult who sees Storm Frisk, MD for primary care. I spoke with  [name]. by phone today.  What matters to the patients health and wellness today?  Patient Stated she is looking for new residence. She may have some financial challenges    Goals Addressed             This Visit's Progress    Patient Stated she is looking for new residence. She may have some financial challenges       Interventions:  Spoke with client about client needs.  Client said she recently had PCP appointment with Dr. Shan Levans. She did not mention any other medical providers she is seeing at present Discussed program support with RN, LCSW, Pharmacist Discussed medication procurement Discussed transport needs. She drives vehicle as needed to appointments, to her job and to complete community errands Discussed vision of client Client said she has Medicaid for insurance. Discussed family support. She said she has decreased family support Discussed mood of client. She said she thought her mood was stable at present Discussed housing. She said she is staying with a friend at present; she said she is looking for a new apartment where she can reside Discussed dental needs. She said she has had a tooth extracted recently.  She is still searching for a dentist she can see on a regular basis for dental care Used Active Listening skills with client to allow client to share her needs Provided some counseling support for client Provided Annabelle Harman LCSW name and phone number and encouraged her to call LCSW as needed for SW support           SDOH assessments and interventions completed:  Yes     Care Coordination Interventions:  Yes, provided   Interventions Today    Flowsheet Row Most Recent Value  Chronic Disease   Chronic disease  during today's visit Other  [spoke with client about client needs]  General Interventions   General Interventions Discussed/Reviewed General Interventions Discussed, Community Resources  Education Interventions   Education Provided Provided Education  Provided Verbal Education On [employer]  [informed client of program support]  Mental Health Interventions   Mental Health Discussed/Reviewed Coping Strategies  [no mood issues noted at present]  Pharmacy Interventions   Pharmacy Dicussed/Reviewed Pharmacy Topics Discussed  Safety Interventions   Safety Discussed/Reviewed Fall Risk        Follow up plan: Follow up call scheduled for 07/15/23 at 9:30 AM     Encounter Outcome:  Patient Visit Completed   Kelton Pillar.Delaine Canter MSW, LCSW Licensed Visual merchandiser [facility] Care Management [phone]

## 2023-05-15 ENCOUNTER — Encounter: Payer: Medicaid Other | Admitting: Pharmacist

## 2023-05-15 ENCOUNTER — Encounter: Payer: Self-pay | Admitting: Pharmacist

## 2023-05-15 DIAGNOSIS — B20 Human immunodeficiency virus [HIV] disease: Secondary | ICD-10-CM

## 2023-07-02 ENCOUNTER — Other Ambulatory Visit (HOSPITAL_COMMUNITY): Payer: Self-pay

## 2023-07-08 ENCOUNTER — Other Ambulatory Visit (HOSPITAL_COMMUNITY): Payer: Self-pay

## 2023-07-10 ENCOUNTER — Telehealth: Payer: Self-pay

## 2023-07-10 NOTE — Telephone Encounter (Signed)
[facility] Patient Advocate Encounter  Patient's medications Joshua Leon have been couriered to [facility] from [employer] and will be administered at the patients appointment on 07/16/23.  [name], CPhT Specialty Pharmacy Patient [facility] for Infectious Disease Phone: [phone] Fax:  [phone]

## 2023-07-15 ENCOUNTER — Ambulatory Visit: Payer: Self-pay | Admitting: Licensed Clinical Social Worker

## 2023-07-15 NOTE — Patient Instructions (Signed)
Visit Information  Thank you for taking time to visit with me today. Please don't hesitate to contact me if I can be of assistance to you.   Following are the goals we discussed today:   Goals Addressed             This Visit's Progress    Patient Stated she is looking for new residence. She may have some financial challenges       Interventions:  Spoke with client today via phone about client needs and status. Client sees Dr. Shan Levans as PCP. Demarrion said Dr. Delford Field will be retiring soon and Matthe will see another provider at the same practice. Rasheen has been in process of looking for new apartment . She has been staying temporarily with a friend while she looks for a new apartment Wenzel has had some dental issues.  She had tooth extracted several weeks ago and is in process of looking for a local dentist. Discussed program support with RN, LCSW, Pharmacist.a Kawika said previously that she drove her vehicle as needed to appointments, to her job, and to complete community errands Client has Medicaid for insurance Discussed family support. She said she has decreased family support Used Active Listening skills with client to allow client to share her needs Provided Elo LCSW name and phone number and encouraged her to call LCSW as needed for SW support . Client was appreciative of call from LCSW today.           LCSW has provided Kelby Fam, with LCSW name and phone number and encouraged Giovanny to call LCSW as needed for SW support   Please call the care guide team at [phone] if you need to cancel or reschedule your appointment.   If you are experiencing a Mental Health or Behavioral Health Crisis or need someone to talk to, please go to [facility] [address], [place] [phone])   The patient verbalized understanding of instructions, educational materials, and care plan provided today and DECLINED offer to receive copy of  patient instructions, educational materials, and care plan.   The patient has been provided with contact information for the care management team and has been advised to call with any health related questions or concerns.    Lorna Few  MSW, LCSW [place] Licensed Clinical Social Worker Direct Dial:  [phone] Fax:  [phone] Website:  [email or web address]

## 2023-07-15 NOTE — Patient Outreach (Signed)
  Care Coordination   Follow Up Visit Note   07/15/2023 Name: Joshua Leon. MRN: [id number] DOB: 15-Jun-1995  Joshua Leon. is Joshua 28 y.o. year old adult who sees Joshua Frisk, MD for primary care. I spoke with  Joshua Leon. by phone today.  What matters to the patients health and wellness today? Patient Stated she is looking for new residence. She may have some financial challenges     Goals Addressed             This Visit's Progress    Patient Stated she is looking for new residence. She may have some financial challenges       Interventions:  Spoke with client today via phone about client needs and status. Client sees Joshua Leon as PCP. Joshua Leon said Joshua Leon will be retiring soon and Joshua Leon will see another provider at the same practice. Joshua Leon has been in process of looking for new apartment . She has been staying temporarily with Joshua friend while she looks for Joshua new apartment Joshua Leon has had some dental issues.  She had tooth extracted several weeks ago and is in process of looking for Joshua local dentist. Discussed program support with RN, Joshua Leon, Pharmacist.Joshua Leon said previously that she drove her vehicle as needed to appointments, to her job, and to complete community errands Client has Medicaid for insurance Discussed family support. She said she has decreased family support Used Active Listening skills with client to allow client to share her needs Provided [place] Joshua Leon name and phone number and encouraged her to call Joshua Leon as needed for SW support . Client was appreciative of call from Joshua Leon today.            SDOH assessments and interventions completed:  Yes  SDOH Interventions Today    Flowsheet Row Most Recent Value  SDOH Interventions   Housing Interventions Other (Comment)  [client is in process of looking for new apartment]  Depression Interventions/Treatment  Counseling        Care Coordination Interventions:  Yes, provided    Interventions Today    Flowsheet Row Most Recent Value  Chronic Disease   Chronic disease during today's visit Other  [spoke with client about client needs]  General Interventions   General Interventions Discussed/Reviewed General Interventions Discussed, Community Resources  Education Interventions   Education Provided Provided Education  Provided Verbal Education On [employer]  Mental Health Interventions   Mental Health Discussed/Reviewed Coping Strategies  [client has anxiety and stress issues related to housing and related to addressing medical needs]  Nutrition Interventions   Nutrition Discussed/Reviewed Nutrition Discussed       Follow up plan: Joshua Leon has provided Joshua Leon with Joshua Leon name and phone number and encouraged Joshua Leon to call Joshua Leon as needed for SW support.     Encounter Outcome:  Patient Visit Completed    Joshua Leon  MSW, Joshua Leon [place] Licensed Clinical Social Worker Direct Dial:  [phone] Fax:  [phone] Website:  [email or web address]

## 2023-07-16 ENCOUNTER — Other Ambulatory Visit: Payer: Self-pay | Admitting: Internal Medicine

## 2023-07-16 ENCOUNTER — Telehealth: Payer: Self-pay

## 2023-07-16 ENCOUNTER — Other Ambulatory Visit: Payer: Self-pay

## 2023-07-16 ENCOUNTER — Encounter: Payer: Medicaid Other | Admitting: Internal Medicine

## 2023-07-16 MED ORDER — BICTEGRAVIR-EMTRICITAB-TENOFOV 50-200-25 MG PO TABS: 1.0000 | ORAL_TABLET | Freq: Every day | ORAL | 2 refills | Status: DC

## 2023-07-16 NOTE — Telephone Encounter (Signed)
Called patient to reschedule missed appointment. Also, per Dr. Luciana Axe informed patient that she will need to go back on oral treatment  Biktarvy since she missed 2 doses of Cabenuva.   Patient verbalized understanding and wanted refills sent to [facility] [address]. Medicaton sent to pharmacy, patient is aware and f/u appointment is scheduled on 3/24.

## 2023-07-19 ENCOUNTER — Other Ambulatory Visit: Payer: Self-pay | Admitting: Internal Medicine

## 2023-07-19 DIAGNOSIS — F649 Gender identity disorder, unspecified: Secondary | ICD-10-CM

## 2023-08-19 ENCOUNTER — Ambulatory Visit: Payer: Medicaid Other | Admitting: Internal Medicine

## 2023-09-10 ENCOUNTER — Telehealth: Payer: Self-pay | Admitting: Pharmacist

## 2023-09-10 NOTE — Telephone Encounter (Addendum)
 Patient previously on Cabenuva ; last injection given in October. Has usually been very consistent following with us  since starting Cabenuva  in May 2023. Has missed two appointments since then and was following with Dr. Comer; have not been able to reach since then. LVM this AM requesting returned call for scheduling appointment; has not been on MyChart since November.   Front staff - could you all reach out to him again this week about scheduling HIV follow-up with myself or Cassie?   Sending to pharmacy as Joshua Leon.  Joshua Leon, PharmD, CPP, BCIDP, AAHIVP Clinical Pharmacist Practitioner Infectious Diseases Clinical Pharmacist [facility]

## 2023-09-15 NOTE — Progress Notes (Unsigned)
 HPI: Joshua Solem. is a 28 y.o. adult who presents to the [facility] pharmacy clinic for HIV follow-up.  Patient Active Problem List   Diagnosis Date Noted   Screen for STD (sexually transmitted disease) 06/05/2022   Dental caries 06/05/2022   Syphilis 04/30/2022   Gender identity disorder, unspecified 01/17/2022   Medication monitoring encounter 05/24/2021   Human immunodeficiency virus (HIV) disease (HCC) 04/28/2021    Patient's Medications  New Prescriptions   No medications on file  Previous Medications   BICTEGRAVIR-EMTRICITABINE-TENOFOVIR AF (BIKTARVY) 50-200-25 MG TABS TABLET    Take 1 tablet by mouth daily.   CETIRIZINE  (ZYRTEC  ALLERGY) 10 MG TABLET    Take 1 tablet (10 mg total) by mouth daily.   CHLORHEXIDINE  (PERIDEX ) 0.12 % SOLUTION    Use as directed 15 mLs in the mouth or throat 2 (two) times daily.   DOXYCYCLINE  (VIBRA -TABS) 100 MG TABLET    Take 1 tablet (100 mg total) by mouth 2 (two) times daily.   DOXYCYCLINE  (VIBRA -TABS) 100 MG TABLET    Take two tablets (200mg ) by mouth as needed within 72 hours after unprotected sex.   ESTRADIOL  VALERATE 10 MG/ML OIL    Inject 0.5 mLs (5 mg total) into the muscle every 14 (fourteen) days.   METHOCARBAMOL  (ROBAXIN ) 500 MG TABLET    Take 1 tablet (500 mg total) by mouth 2 (two) times daily.   NEEDLES & SYRINGES MISC    1 Units by Does not apply route every 14 (fourteen) days.   OMEPRAZOLE  (PRILOSEC) 40 MG CAPSULE    Take 1 capsule (40 mg total) by mouth daily. Take 1 capsule 30 minutes prior to breakfast meal each day.   PANTOPRAZOLE  (PROTONIX ) 40 MG TABLET    Take 1 tablet (40 mg total) by mouth daily.   PROMETHAZINE -DEXTROMETHORPHAN (PROMETHAZINE -DM) 6.25-15 MG/5ML SYRUP    Take 5 mLs by mouth 3 (three) times daily as needed for cough.   PSEUDOEPHEDRINE  (SUDAFED) 60 MG TABLET    Take 1 tablet (60 mg total) by mouth every 8 (eight) hours as needed for congestion.  Modified Medications   No medications on file  Discontinued  Medications   No medications on file    Labs: Lab Results  Component Value Date   HIV1RNAQUANT Not Detected 12/04/2022   HIV1RNAQUANT Not Detected 07/24/2022   HIV1RNAQUANT Not Detected 03/15/2022   CD4TABS 463 07/24/2022   CD4TABS 817 01/01/2022   CD4TABS 581 08/31/2021    RPR and STI Lab Results  Component Value Date   LABRPR REACTIVE (A) 03/19/2023   LABRPR REACTIVE (A) 02/12/2023   LABRPR REACTIVE (A) 12/04/2022   LABRPR REACTIVE (A) 09/06/2022   LABRPR REACTIVE (A) 08/21/2022   RPRTITER 1:2 (H) 03/19/2023   RPRTITER 1:2 (H) 02/12/2023   RPRTITER 1:4 (H) 12/04/2022   RPRTITER 1:1 (H) 09/06/2022   RPRTITER 1:4 (H) 08/21/2022    STI Results GC CT  05/01/2023  3:08 PM Negative    Negative    Negative  Negative    Negative    Negative   03/19/2023 10:10 AM Negative    Negative  Negative    Negative   02/12/2023 10:02 AM Negative    Negative  Negative    Negative   12/04/2022  3:23 PM Negative    Negative    Negative  Positive    Negative    Negative   09/06/2022  1:46 PM Negative    Negative    Negative  Negative  Negative    Negative   08/21/2022 11:05 AM Negative  Negative   08/21/2022 10:12 AM Negative  Negative   07/24/2022 11:29 AM Negative    Negative  Negative    Negative   06/07/2022 10:54 AM Negative    Negative    Negative  Negative    Negative    Negative   05/16/2022 11:32 AM Negative    Negative  Negative    Negative   05/16/2022 11:21 AM Negative  Negative   04/30/2022 11:00 AM Positive    Negative    Negative  Negative    Negative    Negative   03/15/2022  2:55 PM Negative    Negative    Negative  Negative    Negative    Negative   11/16/2021 11:40 AM Negative    Negative    Negative  Negative    Negative    Negative   10/18/2021  3:30 PM Other    Negative  Other    Negative   08/31/2021  9:07 AM Negative    Positive  Negative    Negative   04/28/2021 11:01 AM Negative    Negative  Negative    Negative      Hepatitis B Lab Results  Component Value Date   HEPBSAB REACTIVE (A) 07/24/2022   HEPBSAG NON-REACTIVE 04/28/2021   HEPBCAB NON-REACTIVE 04/28/2021   Hepatitis C Lab Results  Component Value Date   HEPCAB NON-REACTIVE 04/28/2021   Hepatitis A Lab Results  Component Value Date   HAV REACTIVE (A) 04/28/2021   Lipids: Lab Results  Component Value Date   CHOL 182 07/24/2022   TRIG 67 07/24/2022   HDL 64 07/24/2022   CHOLHDL 2.8 07/24/2022   LDLCALC 103 (H) 07/24/2022    Current HIV Regimen: Biktarvy  Assessment: Joshua Leon is a 28 yo male presenting for an HIV follow-up appointment. She was previously on Cabenuva  with last injections given in October. She missed 2 appointments and was put on Biktarvy back in February. She is ***  Last HIV RNA was undetectable in 11/2022, will update today. Will also update CD4 count. Last annual CMP, CBC and lipid panel were in 06/2022, will update today.  Immunizations: She is eligible for the shingles vaccination.   Plan: ***

## 2023-09-16 ENCOUNTER — Ambulatory Visit: Admitting: Pharmacist

## 2023-09-16 ENCOUNTER — Other Ambulatory Visit (HOSPITAL_COMMUNITY): Payer: Self-pay

## 2023-09-16 DIAGNOSIS — Z113 Encounter for screening for infections with a predominantly sexual mode of transmission: Secondary | ICD-10-CM

## 2023-09-16 DIAGNOSIS — B20 Human immunodeficiency virus [HIV] disease: Secondary | ICD-10-CM

## 2023-09-16 NOTE — Progress Notes (Signed)
 HPI: Joshua Leon. is a 28 y.o. adult who presents to the [facility] pharmacy clinic for HIV follow-up.  Patient Active Problem List   Diagnosis Date Noted   Screen for STD (sexually transmitted disease) 06/05/2022   Dental caries 06/05/2022   Syphilis 04/30/2022   Gender identity disorder, unspecified 01/17/2022   Medication monitoring encounter 05/24/2021   Human immunodeficiency virus (HIV) disease (HCC) 04/28/2021    Patient's Medications  New Prescriptions   No medications on file  Previous Medications   BICTEGRAVIR-EMTRICITABINE-TENOFOVIR AF (BIKTARVY) 50-200-25 MG TABS TABLET    Take 1 tablet by mouth daily.   CETIRIZINE  (ZYRTEC  ALLERGY) 10 MG TABLET    Take 1 tablet (10 mg total) by mouth daily.   CHLORHEXIDINE  (PERIDEX ) 0.12 % SOLUTION    Use as directed 15 mLs in the mouth or throat 2 (two) times daily.   DOXYCYCLINE  (VIBRA -TABS) 100 MG TABLET    Take 1 tablet (100 mg total) by mouth 2 (two) times daily.   DOXYCYCLINE  (VIBRA -TABS) 100 MG TABLET    Take two tablets (200mg ) by mouth as needed within 72 hours after unprotected sex.   ESTRADIOL  VALERATE 10 MG/ML OIL    Inject 0.5 mLs (5 mg total) into the muscle every 14 (fourteen) days.   METHOCARBAMOL  (ROBAXIN ) 500 MG TABLET    Take 1 tablet (500 mg total) by mouth 2 (two) times daily.   NEEDLES & SYRINGES MISC    1 Units by Does not apply route every 14 (fourteen) days.   OMEPRAZOLE  (PRILOSEC) 40 MG CAPSULE    Take 1 capsule (40 mg total) by mouth daily. Take 1 capsule 30 minutes prior to breakfast meal each day.   PANTOPRAZOLE  (PROTONIX ) 40 MG TABLET    Take 1 tablet (40 mg total) by mouth daily.   PROMETHAZINE -DEXTROMETHORPHAN (PROMETHAZINE -DM) 6.25-15 MG/5ML SYRUP    Take 5 mLs by mouth 3 (three) times daily as needed for cough.   PSEUDOEPHEDRINE  (SUDAFED) 60 MG TABLET    Take 1 tablet (60 mg total) by mouth every 8 (eight) hours as needed for congestion.  Modified Medications   No medications on file  Discontinued  Medications   No medications on file    Labs: Lab Results  Component Value Date   HIV1RNAQUANT Not Detected 12/04/2022   HIV1RNAQUANT Not Detected 07/24/2022   HIV1RNAQUANT Not Detected 03/15/2022   CD4TABS 463 07/24/2022   CD4TABS 817 01/01/2022   CD4TABS 581 08/31/2021    RPR and STI Lab Results  Component Value Date   LABRPR REACTIVE (A) 03/19/2023   LABRPR REACTIVE (A) 02/12/2023   LABRPR REACTIVE (A) 12/04/2022   LABRPR REACTIVE (A) 09/06/2022   LABRPR REACTIVE (A) 08/21/2022   RPRTITER 1:2 (H) 03/19/2023   RPRTITER 1:2 (H) 02/12/2023   RPRTITER 1:4 (H) 12/04/2022   RPRTITER 1:1 (H) 09/06/2022   RPRTITER 1:4 (H) 08/21/2022    STI Results GC CT  05/01/2023  3:08 PM Negative    Negative    Negative  Negative    Negative    Negative   03/19/2023 10:10 AM Negative    Negative  Negative    Negative   02/12/2023 10:02 AM Negative    Negative  Negative    Negative   12/04/2022  3:23 PM Negative    Negative    Negative  Positive    Negative    Negative   09/06/2022  1:46 PM Negative    Negative    Negative  Negative  Negative    Negative   08/21/2022 11:05 AM Negative  Negative   08/21/2022 10:12 AM Negative  Negative   07/24/2022 11:29 AM Negative    Negative  Negative    Negative   06/07/2022 10:54 AM Negative    Negative    Negative  Negative    Negative    Negative   05/16/2022 11:32 AM Negative    Negative  Negative    Negative   05/16/2022 11:21 AM Negative  Negative   04/30/2022 11:00 AM Positive    Negative    Negative  Negative    Negative    Negative   03/15/2022  2:55 PM Negative    Negative    Negative  Negative    Negative    Negative   11/16/2021 11:40 AM Negative    Negative    Negative  Negative    Negative    Negative   10/18/2021  3:30 PM Other    Negative  Other    Negative   08/31/2021  9:07 AM Negative    Positive  Negative    Negative   04/28/2021 11:01 AM Negative    Negative  Negative    Negative      Hepatitis B Lab Results  Component Value Date   HEPBSAB REACTIVE (A) 07/24/2022   HEPBSAG NON-REACTIVE 04/28/2021   HEPBCAB NON-REACTIVE 04/28/2021   Hepatitis C Lab Results  Component Value Date   HEPCAB NON-REACTIVE 04/28/2021   Hepatitis A Lab Results  Component Value Date   HAV REACTIVE (A) 04/28/2021   Lipids: Lab Results  Component Value Date   CHOL 182 07/24/2022   TRIG 67 07/24/2022   HDL 64 07/24/2022   CHOLHDL 2.8 07/24/2022   LDLCALC 103 (H) 07/24/2022    Current HIV Regimen: Biktarvy  Assessment: Joshua Leon is a 28 yo male presenting for an HIV follow-up appointment. She was previously on Cabenuva  with last injections given in October. She missed 2 appointments stating her car broke down and had personal issues precluding her from making appointments. She was put on oral Biktarvy back in February but was untreated for about 2-3 months. She  remains on Biktarvy stating that she sometimes forgets to take the dose at the same time every day but expresses she is still taking it daily.   Discussed options with patient including restarting Cabenuva  injections or staying on oral therapy. Patient would prefer to be on Cabenuva . Discussed the importance of showing up to appointments and staying within her target date. Patient expressed understanding.   Administered cabotegravir  600mg /33mL in left upper outer quadrant of the gluteal muscle. Administered rilpivirine  900 mg/3mL in the right upper outer quadrant of the gluteal muscle. No issues with injections. Eligah will follow up in 1 month for next set of injections.  Last HIV RNA was undetectable in 11/2022, will update today. Will also update CD4 count. Last annual CMP, CBC and lipid panel were in 06/2022, will update today. Patient also would like routine STI screening. Will order RPR, oral/rectal/urine cytologies.  Plan: - HIV RNA, CD4 count, CBC, CMP, lipid panel - RPR, oral/rectal/urine cytologies  - Stop taking  Biktarvy after today's dose - Next initiation injection on 10/10/2023 with Dr. Zelda Hickman  - Maintenance injections scheduled for 12/10/2023 with Cassie - Call with questions or concerns   Barbra Boone, PharmD PGY1 Acute Care Pharmacy Resident  09/17/2023 10:36 AM

## 2023-09-17 ENCOUNTER — Ambulatory Visit (INDEPENDENT_AMBULATORY_CARE_PROVIDER_SITE_OTHER): Admitting: Pharmacist

## 2023-09-17 ENCOUNTER — Other Ambulatory Visit: Payer: Self-pay

## 2023-09-17 ENCOUNTER — Other Ambulatory Visit (HOSPITAL_COMMUNITY): Payer: Self-pay

## 2023-09-17 ENCOUNTER — Telehealth: Payer: Self-pay | Admitting: *Deleted

## 2023-09-17 DIAGNOSIS — B20 Human immunodeficiency virus [HIV] disease: Secondary | ICD-10-CM | POA: Diagnosis present

## 2023-09-17 DIAGNOSIS — Z113 Encounter for screening for infections with a predominantly sexual mode of transmission: Secondary | ICD-10-CM

## 2023-09-17 MED ORDER — CABOTEGRAVIR & RILPIVIRINE ER 600 & 900 MG/3ML IM SUER
1.0000 | Freq: Once | INTRAMUSCULAR | Status: AC
  Administered 2023-09-17: 1 via INTRAMUSCULAR

## 2023-09-17 MED ORDER — CABENUVA 600 & 900 MG/3ML IM SUER
1.0000 | INTRAMUSCULAR | 1 refills | Status: DC
  Filled 2023-09-17: qty 6, 30d supply, fill #0
  Filled 2023-09-30 – 2023-10-07 (×2): qty 6, 30d supply, fill #1

## 2023-09-17 NOTE — Telephone Encounter (Signed)
 Left message on voicemail to return call.

## 2023-09-17 NOTE — Progress Notes (Signed)
 Specialty Pharmacy Initial Fill Coordination Note  Joshua Bozard. is a 28 y.o. adult contacted today regarding initial fill of specialty medication(s) Cabotegravir  & Rilpivirine  (Cabenuva )   Patient requested Courier to Provider Office   Delivery date: 09/19/23   Verified address: [address] [place] [address]   Medication will be filled on 09/18/23.   Patient is aware of 0.00 copayment.

## 2023-09-17 NOTE — Telephone Encounter (Signed)
 Patient came in requesting refills on Estradiol  Valerate 10 MG/ML OIL

## 2023-09-18 ENCOUNTER — Other Ambulatory Visit: Payer: Self-pay

## 2023-09-18 LAB — GC/CHLAMYDIA PROBE, AMP (THROAT)
Chlamydia trachomatis RNA: NOT DETECTED
Neisseria gonorrhoeae RNA: NOT DETECTED

## 2023-09-18 LAB — CT/NG RNA, TMA RECTAL
Chlamydia Trachomatis RNA: NOT DETECTED
Neisseria Gonorrhoeae RNA: NOT DETECTED

## 2023-09-20 ENCOUNTER — Telehealth: Payer: Self-pay

## 2023-09-20 LAB — CBC
HCT: 43.8 % (ref 38.5–50.0)
Hemoglobin: 14.4 g/dL (ref 13.2–17.1)
MCH: 29.3 pg (ref 27.0–33.0)
MCHC: 32.9 g/dL (ref 32.0–36.0)
MCV: 89.2 fL (ref 80.0–100.0)
MPV: 10.4 fL (ref 7.5–12.5)
Platelets: 183 10*3/uL (ref 140–400)
RBC: 4.91 10*6/uL (ref 4.20–5.80)
RDW: 13.8 % (ref 11.0–15.0)
WBC: 4.4 10*3/uL (ref 3.8–10.8)

## 2023-09-20 LAB — T-HELPER CELLS (CD4) COUNT (NOT AT ARMC)
Absolute CD4: 1107 {cells}/uL (ref 490–1740)
CD4 T Helper %: 39 % (ref 30–61)
Total lymphocyte count: 2857 {cells}/uL (ref 850–3900)

## 2023-09-20 LAB — LIPID PANEL
Cholesterol: 141 mg/dL (ref <200)
HDL: 58 mg/dL (ref >=40)
LDL Cholesterol (Calc): 69 mg/dL
Non-HDL Cholesterol (Calc): 83 mg/dL (ref <130)
Total CHOL/HDL Ratio: 2.4 (calc) (ref <5.0)
Triglycerides: 64 mg/dL (ref <150)

## 2023-09-20 LAB — COMPREHENSIVE METABOLIC PANEL WITH GFR
AG Ratio: 1.7 (calc) (ref 1.0–2.5)
ALT: 15 U/L (ref 9–46)
AST: 21 U/L (ref 10–40)
Albumin: 4.4 g/dL (ref 3.6–5.1)
Alkaline phosphatase (APISO): 62 U/L (ref 36–130)
BUN: 10 mg/dL (ref 7–25)
CO2: 29 mmol/L (ref 20–32)
Calcium: 9 mg/dL (ref 8.6–10.3)
Chloride: 101 mmol/L (ref 98–110)
Creat: 1.04 mg/dL (ref 0.60–1.24)
Globulin: 2.6 g/dL (ref 1.9–3.7)
Glucose, Bld: 86 mg/dL (ref 65–99)
Potassium: 3.6 mmol/L (ref 3.5–5.3)
Sodium: 139 mmol/L (ref 135–146)
Total Bilirubin: 0.7 mg/dL (ref 0.2–1.2)
Total Protein: 7 g/dL (ref 6.1–8.1)
eGFR: 102 mL/min/{1.73_m2} (ref >=60)

## 2023-09-20 LAB — RPR: RPR Ser Ql: NONREACTIVE

## 2023-09-20 LAB — HIV RNA, RTPCR W/R GT (RTI, PI,INT)
HIV 1 RNA Quant: NOT DETECTED {copies}/mL
HIV-1 RNA Quant, Log: NOT DETECTED {Log_copies}/mL

## 2023-09-20 NOTE — Telephone Encounter (Addendum)
 [facility] Patient Advocate Encounter  Patient's medications CABENUVA  have been couriered to [facility] from [facility] and will be administered at the patients appointment on 09/17/23.  Verline Glow, CPhT Specialty Pharmacy Patient [facility] Phone: [phone] Fax:  [phone]

## 2023-09-20 NOTE — Telephone Encounter (Signed)
 Left message on voicemail to return call.

## 2023-09-24 ENCOUNTER — Other Ambulatory Visit: Payer: Self-pay

## 2023-09-30 ENCOUNTER — Other Ambulatory Visit (HOSPITAL_COMMUNITY): Payer: Self-pay

## 2023-09-30 ENCOUNTER — Other Ambulatory Visit: Payer: Self-pay

## 2023-09-30 NOTE — Progress Notes (Signed)
 Specialty Pharmacy Refill Coordination Note  Joshua Leon. is a 28 y.o. adult assessed today regarding refills of clinic administered specialty medication(s) Cabotegravir  & Rilpivirine  (Cabenuva )   Clinic requested Courier to Provider Office   Delivery date: 10/08/23   Verified address: [address] [place] [address]   Medication will be filled on 10/07/23.

## 2023-10-07 ENCOUNTER — Other Ambulatory Visit: Payer: Self-pay

## 2023-10-07 ENCOUNTER — Other Ambulatory Visit (HOSPITAL_COMMUNITY): Payer: Self-pay

## 2023-10-10 ENCOUNTER — Ambulatory Visit (INDEPENDENT_AMBULATORY_CARE_PROVIDER_SITE_OTHER): Admitting: Internal Medicine

## 2023-10-10 ENCOUNTER — Telehealth: Payer: Self-pay

## 2023-10-10 ENCOUNTER — Other Ambulatory Visit: Payer: Self-pay

## 2023-10-10 ENCOUNTER — Encounter: Payer: Self-pay | Admitting: Internal Medicine

## 2023-10-10 VITALS — BP 122/82 | HR 80 | Resp 16 | Ht 69.0 in | Wt 204.0 lb

## 2023-10-10 DIAGNOSIS — B20 Human immunodeficiency virus [HIV] disease: Secondary | ICD-10-CM

## 2023-10-10 MED ORDER — CABOTEGRAVIR & RILPIVIRINE ER 600 & 900 MG/3ML IM SUER
1.0000 | Freq: Once | INTRAMUSCULAR | Status: AC
Start: 2023-10-10 — End: 2023-10-10
  Administered 2023-10-10: 1 via INTRAMUSCULAR

## 2023-10-10 NOTE — Telephone Encounter (Signed)
 Patient came to front desk after calling PCP and asked if we can help with Estradiol  until she is seen in one month by PCP. Patient last reported taking Estradiol  in January 2025.Discussed this during the visit and did inform her that Dr.Singh does not manage this medication. Kim reinformed patient that she will need to address this with PCP at that appointment. Patient was understanding.

## 2023-10-10 NOTE — Progress Notes (Signed)
 [facility]     HPI: Joshua Leon. is a 28 y.o. adult  M->F presents for HIV management on cab    Date of diagnosis oct 2023 ART exposure biktarvy Past Ois no Risk factors: MSM, Partners in the last 12 months 2.  Anal sex verse  Social: Occupation:not employed currently Housing:with sister, apt Support: yes Understanding of HIV: Etoh/drug/tobacco use: Socail/mgfj no Past Medical History:  Diagnosis Date   COVID-19 virus infection 01/01/2022   Mallory-Weiss tear    Open fracture of tooth 06/05/2022   Pneumonia     Past Surgical History:  Procedure Laterality Date   ESOPHAGOGASTRODUODENOSCOPY (EGD) WITH PROPOFOL  N/A 01/02/2022   Procedure: ESOPHAGOGASTRODUODENOSCOPY (EGD) WITH PROPOFOL ;  Surgeon: Alvis Jourdain, MD;  Location: [facility];  Service: Gastroenterology;  Laterality: N/A;  Hematememesis, anemia, blood in stool.I   HEMOSTASIS CLIP PLACEMENT  01/02/2022   Procedure: HEMOSTASIS CLIP PLACEMENT;  Surgeon: Alvis Jourdain, MD;  Location: [facility];  Service: Gastroenterology;;    Family History  Problem Relation Age of Onset   Graves' disease Mother    Thyroid disease Mother    Sarcoidosis Maternal Aunt    Hypertension Maternal Grandmother    Cancer Neg Hx    Heart failure Neg Hx    Hyperlipidemia Neg Hx    Stomach cancer Neg Hx    Pancreatic cancer Neg Hx    Esophageal cancer Neg Hx    Colon cancer Neg Hx    Colon polyps Neg Hx    Current Outpatient Medications on File Prior to Visit  Medication Sig Dispense Refill   cabotegravir  & rilpivirine  ER (CABENUVA ) 600 & 900 MG/3ML injection Inject 1 kit into the muscle every 30 (thirty) days. 6 mL 1   doxycycline  (VIBRA -TABS) 100 MG tablet Take two tablets (200mg ) by mouth as needed within 72 hours after unprotected sex. 60 tablet 2   Estradiol  Valerate 10 MG/ML OIL Inject 0.5 mLs (5 mg total) into the muscle every 14 (fourteen) days. 5 mL 0   cetirizine  (ZYRTEC  ALLERGY)  10 MG tablet Take 1 tablet (10 mg total) by mouth daily. (Patient not taking: Reported on 10/10/2023) 30 tablet 0   chlorhexidine  (PERIDEX ) 0.12 % solution Use as directed 15 mLs in the mouth or throat 2 (two) times daily. (Patient not taking: Reported on 10/10/2023) 120 mL 0   doxycycline  (VIBRA -TABS) 100 MG tablet Take 1 tablet (100 mg total) by mouth 2 (two) times daily. (Patient not taking: Reported on 10/10/2023) 14 tablet 0   methocarbamol  (ROBAXIN ) 500 MG tablet Take 1 tablet (500 mg total) by mouth 2 (two) times daily. (Patient not taking: Reported on 10/10/2023) 20 tablet 0   Needles & Syringes MISC 1 Units by Does not apply route every 14 (fourteen) days. (Patient not taking: Reported on 10/10/2023) 30 Units 11   omeprazole  (PRILOSEC) 40 MG capsule Take 1 capsule (40 mg total) by mouth daily. Take 1 capsule 30 minutes prior to breakfast meal each day. (Patient not taking: Reported on 10/10/2023) 30 capsule 2   pantoprazole  (PROTONIX ) 40 MG tablet Take 1 tablet (40 mg total) by mouth daily. (Patient not taking: Reported on 10/10/2023) 30 tablet 3   promethazine -dextromethorphan (PROMETHAZINE -DM) 6.25-15 MG/5ML syrup Take 5 mLs by mouth 3 (three) times daily as needed for cough. (Patient not taking: Reported on 10/10/2023) 200 mL 0   pseudoephedrine  (SUDAFED) 60 MG tablet Take 1 tablet (60 mg total) by mouth every 8 (eight) hours  as needed for congestion. (Patient not taking: Reported on 10/10/2023) 30 tablet 0   No current facility-administered medications on file prior to visit.    No Known Allergies    Lab Results HIV 1 RNA Quant  Date Value  09/17/2023 NOT DETECTED copies/mL  12/04/2022 Not Detected Copies/mL  07/24/2022 Not Detected Copies/mL   CD4 T Cell Abs (/uL)  Date Value  07/24/2022 463  01/01/2022 817  08/31/2021 581   No results found for: "HIV1GENOSEQ" Lab Results  Component Value Date   WBC 4.4 09/17/2023   HGB 14.4 09/17/2023   HCT 43.8 09/17/2023   MCV 89.2  09/17/2023   PLT 183 09/17/2023    Lab Results  Component Value Date   CREATININE 1.04 09/17/2023   BUN 10 09/17/2023   NA 139 09/17/2023   K 3.6 09/17/2023   CL 101 09/17/2023   CO2 29 09/17/2023   Lab Results  Component Value Date   ALT 15 09/17/2023   AST 21 09/17/2023   ALKPHOS 66 01/31/2022   BILITOT 0.7 09/17/2023    Lab Results  Component Value Date   CHOL 141 09/17/2023   TRIG 64 09/17/2023   HDL 58 09/17/2023   LDLCALC 69 09/17/2023   Lab Results  Component Value Date   HAV REACTIVE (A) 04/28/2021   Lab Results  Component Value Date   HEPBSAG NON-REACTIVE 04/28/2021   HEPBSAB REACTIVE (A) 07/24/2022   No results found for: "HCVAB" Lab Results  Component Value Date   CHLAMYDIAWP Negative 05/01/2023   CHLAMYDIAWP Negative 05/01/2023   CHLAMYDIAWP Negative 05/01/2023   N Negative 05/01/2023   N Negative 05/01/2023   N Negative 05/01/2023   No results found for: "GCPROBEAPT" No results found for: "QUANTGOLD"  Assessment/Plan #HIV/Aysmtomatic -CD4 463, Vlnd o, on 09/17/23. Labs reviewed -cab today -f/u with pharmacy for cab inj. 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 Professional time spent includes the following activities: Preparing to see the patient (review of tests), Obtaining and/or reviewing separately obtained history (admission/discharge record), Performing a medically appropriate examination and/or  evaluation , Ordering medications/tests/procedures, referring and communicating with other health care professionals, Documenting clinical information in the EMR, Independently interpreting results (not separately reported), Communicating results to the patient/family/caregiver, Counseling and educating the patient/family/caregiver and Care coordination (not separately reported).

## 2023-10-14 ENCOUNTER — Telehealth: Payer: Self-pay

## 2023-10-14 NOTE — Telephone Encounter (Signed)
 [facility] Patient Advocate Encounter  Patient's medications cabenuva  have been couriered to [facility] from [facility] and was administered at the patients appointment on 10/10/23.  Roylene Corn, CPhT Specialty Pharmacy Patient [facility] for Infect[facility] Disease Phone: [phone] Fax:  [phone]

## 2023-11-04 ENCOUNTER — Ambulatory Visit: Payer: Medicaid Other | Admitting: Family Medicine

## 2023-11-06 ENCOUNTER — Telehealth: Payer: Self-pay | Admitting: Critical Care Medicine

## 2023-11-06 ENCOUNTER — Ambulatory Visit: Attending: Family Medicine | Admitting: Family Medicine

## 2023-11-06 ENCOUNTER — Other Ambulatory Visit: Payer: Self-pay

## 2023-11-06 NOTE — Telephone Encounter (Signed)
 Copied from CRM [id number]. Topic: General - Other >> Nov 06, 2023  2:36 PM Marissa P wrote:  Reason for CRM: Patient called to request prescription to be sent down of the Estradiol  Valerate 10 MG/ML OIL Please and the rep said she can do it at the pharmacy

## 2023-11-07 NOTE — Telephone Encounter (Signed)
 Call placed to patient unable to reach message left on VM.

## 2023-11-08 NOTE — Telephone Encounter (Signed)
 Call placed to patient unable to reach message left on VM.

## 2023-11-28 ENCOUNTER — Other Ambulatory Visit: Payer: Self-pay

## 2023-11-28 ENCOUNTER — Other Ambulatory Visit: Payer: Self-pay | Admitting: Pharmacist

## 2023-11-28 ENCOUNTER — Other Ambulatory Visit (HOSPITAL_COMMUNITY): Payer: Self-pay

## 2023-11-28 DIAGNOSIS — B20 Human immunodeficiency virus [HIV] disease: Secondary | ICD-10-CM

## 2023-11-28 MED ORDER — CABOTEGRAVIR & RILPIVIRINE ER 600 & 900 MG/3ML IM SUER
1.0000 | INTRAMUSCULAR | 5 refills | Status: AC
  Filled 2023-11-28: qty 6, 30d supply, fill #0
  Filled 2024-01-28: qty 6, 30d supply, fill #1
  Filled 2024-04-02: qty 6, 30d supply, fill #2
  Filled 2024-06-11: qty 6, 30d supply, fill #3

## 2023-11-28 NOTE — Progress Notes (Signed)
 Specialty Pharmacy Refill Coordination Note  Joshua Sox. is a 28 y.o. adult assessed today regarding refills of clinic administered specialty medication(s) Cabotegravir  & Rilpivirine  (CABENUVA )   Clinic requested Courier to Provider Office   Delivery date: 12/05/23   Verified address: [address] [place] [address]   Medication will be filled on 12/04/23.

## 2023-12-03 ENCOUNTER — Telehealth: Payer: Self-pay | Admitting: Critical Care Medicine

## 2023-12-03 NOTE — Telephone Encounter (Signed)
 Called pt to confirm appt. Pt will be present.

## 2023-12-04 ENCOUNTER — Telehealth: Payer: Self-pay

## 2023-12-04 ENCOUNTER — Other Ambulatory Visit (HOSPITAL_COMMUNITY): Payer: Self-pay

## 2023-12-04 ENCOUNTER — Other Ambulatory Visit: Payer: Self-pay

## 2023-12-04 ENCOUNTER — Ambulatory Visit: Attending: Family Medicine | Admitting: Family Medicine

## 2023-12-04 ENCOUNTER — Encounter: Payer: Self-pay | Admitting: Family Medicine

## 2023-12-04 VITALS — BP 138/85 | HR 60 | Wt 207.0 lb

## 2023-12-04 DIAGNOSIS — F649 Gender identity disorder, unspecified: Secondary | ICD-10-CM

## 2023-12-04 DIAGNOSIS — B20 Human immunodeficiency virus [HIV] disease: Secondary | ICD-10-CM

## 2023-12-04 DIAGNOSIS — Z131 Encounter for screening for diabetes mellitus: Secondary | ICD-10-CM | POA: Diagnosis not present

## 2023-12-04 LAB — POCT GLYCOSYLATED HEMOGLOBIN (HGB A1C): HbA1c, POC (prediabetic range): 5.3 % — AB (ref 5.7–6.4)

## 2023-12-04 NOTE — Telephone Encounter (Signed)
 Please see my notes from today's visit for detailed explanation which I gave to the patient.

## 2023-12-04 NOTE — Telephone Encounter (Signed)
 Copied from CRM [id number]. Topic: Clinical - Medication Question >> Dec 04, 2023  3:36 PM Donna BRAVO wrote: Reason for CRM: Ted with Infectious Disease office [phone] calling stating patient asked their provider to refill Estradiol  Valerate 10 MG/ML OIL.  Ted stated this need to be refilled by PCP Dr Brien. Cecilia did not know which pharmacy patient wants the prescription to be sent to  Please call patient to confirm if medication refill is needed   ----------------------------------------------------------------------- From previous Reason for Contact - Medication Refill: Medication:   Has the patient contacted their pharmacy?   (Agent: If no, request that the patient contact the pharmacy for the refill. If patient does not wish to contact the pharmacy document the reason why and proceed with request.) (Agent: If yes, when and what did the pharmacy advise?)  This is the patient's preferred pharmacy:  [id number] [place], [place] - [address] AT [facility] OF [address] [place] [address] Phone: [phone] Fax: [phone]  [facility] [phone] @ [address] [place], [place] - [address] [place] [address] Phone: [phone] Fax: [phone]  [id number] - [place], [place] - [address] AT [facility] [address] [place] [address] Phone: [phone] Fax: [phone]  [place] [facility] [address] [place] [address] Phone: [phone] Fax: [phone]  [facility] [address] [place] [address] Phone: [phone] Fax: [phone]  Is this the correct pharmacy for this prescription?   If no, delete pharmacy and type the correct one.   Has the prescription been filled recently?    Is the patient out  of the medication?    Has the patient been seen for an appointment in the last year OR does the patient have an upcoming appointment?    Can we respond through MyChart?    Agent: Please be advised that Rx refills may take up to 3 business days. We ask that you follow-up with your pharmacy.

## 2023-12-04 NOTE — Patient Instructions (Signed)
 VISIT SUMMARY:  Joshua Leon., a 28 year old male, visited for a follow-up regarding estrogen therapy for gender transition. She has been unable to obtain her estrogen prescription since her previous provider retired. She also requested diabetes screening and comprehensive STD testing.  YOUR PLAN:  -ESTROGEN THERAPY MANAGEMENT: You are unable to access estrogen therapy due to your previous provider's retirement. Estrogen therapy is important for your gender transition but carries risks for diabetes, kidney, and liver function. You are advised to seek care from [facility] or confirm with the Infect[facility] Disease Clinic for your estrogen prescription. Regular monitoring of your kidney and liver function is essential.  -DIABETES SCREENING: Estrogen therapy can increase the risk of diabetes. Your A1c level is currently 5.3, indicating no diabetes at this time. Regular monitoring of your A1c levels is advised to ensure early detection if diabetes develops.  -GENERAL HEALTH MAINTENANCE: We reviewed your medication list and confirmed that you are not taking any other medications. It is important to monitor your kidney and liver function regularly due to the risks associated with estrogen therapy.  -SEXUALLY TRANSMITTED INFECTION (STI) SCREENING: You requested comprehensive STI screening, including tests for chlamydia and other STIs. We will perform these tests using urine, swab, and blood samples. The results will be reviewed and discussed with you once they are available.  INSTRUCTIONS:  Please follow up with [facility] or the Infect[facility] Disease Clinic to manage your estrogen therapy. Continue to monitor your A1c levels regularly. We will contact you to discuss the results of your STI screening once they are available.

## 2023-12-04 NOTE — Progress Notes (Signed)
 Subjective:  Patient ID: Joshua Leon., adult    DOB: Oct 09, 1995  Age: 28 y.o. MRN: [id number]  CC: Medical Management of Chronic Issues     Discussed the use of AI scribe software for clinical note transcription with the patient, who gave verbal consent to proceed.  History of Present Illness Joshua Leon. is a 28 year old transgender male with HIV previously followed by Dr. Brien who presents for a follow-up regarding estrogen therapy for gender transition.  She is unable to obtain her estrogen prescription, previously managed by the [facility] and Dr. Brien, who has retired. She has not had estrogen since December and seeks assistance for a refill.  She is on HIV medications from the Infect[facility] Disease Clinic with no current issues.  She has no current blood pressure issues, with a previous reading of 146/87 and no antihypertensive medications but blood pressure is normal today.  She requests STD testing, including chlamydia and syphilis, as she is now with a new partner.    Past Medical History:  Diagnosis Date   COVID-19 virus infection 01/01/2022   Mallory-Weiss tear    Open fracture of tooth 06/05/2022   Pneumonia     Past Surgical History:  Procedure Laterality Date   ESOPHAGOGASTRODUODENOSCOPY (EGD) WITH PROPOFOL  N/A 01/02/2022   Procedure: ESOPHAGOGASTRODUODENOSCOPY (EGD) WITH PROPOFOL ;  Surgeon: Rollin Dover, MD;  Location: [facility];  Service: Gastroenterology;  Laterality: N/A;  Hematememesis, anemia, blood in stool.I   HEMOSTASIS CLIP PLACEMENT  01/02/2022   Procedure: HEMOSTASIS CLIP PLACEMENT;  Surgeon: Rollin Dover, MD;  Location: [facility];  Service: Gastroenterology;;    Family History  Problem Relation Age of Onset   Graves' disease Mother    Thyroid disease Mother    Sarcoidosis Maternal Aunt    Hypertension Maternal Grandmother    Cancer Neg Hx    Heart failure Neg Hx    Hyperlipidemia Neg Hx    Stomach  cancer Neg Hx    Pancreatic cancer Neg Hx    Esophageal cancer Neg Hx    Colon cancer Neg Hx    Colon polyps Neg Hx     Social History   Socioeconomic History   Marital status: Single    Spouse name: Not on file   Number of children: Not on file   Years of education: Not on file   Highest education level: Not on file  Occupational History   Not on file  Tobacco Use   Smoking status: Former    Types: Cigars   Smokeless tobacco: Never  Vaping Use   Vaping status: Never Used  Substance and Sexual Activity   Alcohol use: Yes    Comment: ocassionally   Drug use: Yes    Types: Marijuana   Sexual activity: Yes    Partners: Female, Male  Other Topics Concern   Not on file  Social History Narrative   Not on file   Social Drivers of Health   Financial Resource Strain: Medium Risk (05/07/2023)   Overall Financial Resource Strain (CARDIA)    Difficulty of Paying Living Expenses: Somewhat hard  Food Insecurity: Food Insecurity Present (05/07/2023)   Hunger Vital Sign    Worried About Running Out of Food in the Last Year: Never true    Ran Out of Food in the Last Year: Sometimes true  Transportation Needs: No Transportation Needs (05/07/2023)   PRAPARE - Transportation    Lack of Transportation (Medical): No    Lack of  Transportation (Non-Medical): No  Physical Activity: Not on file  Stress: No Stress Concern Present (05/07/2023)   [employer] of Occupational Health - Occupational Stress Questionnaire    Feeling of Stress : Not at all  Social Connections: Unknown (05/07/2023)   Social Connection and Isolation Panel    Frequency of Communication with Friends and Family: Once a week    Frequency of Social Gatherings with Friends and Family: Three times a week    Attends Religious Services: Never    Active Member of Clubs or Organizations: No    Attends Banker Meetings: Never    Marital Status: Not on file    No Known Allergies  Outpatient  Medications Prior to Visit  Medication Sig Dispense Refill   cabotegravir  & rilpivirine  ER (CABENUVA ) 600 & 900 MG/3ML injection Inject 1 kit into the muscle every 2 (two) months. 6 mL 5   cetirizine  (ZYRTEC  ALLERGY) 10 MG tablet Take 1 tablet (10 mg total) by mouth daily. (Patient not taking: Reported on 10/10/2023) 30 tablet 0   chlorhexidine  (PERIDEX ) 0.12 % solution Use as directed 15 mLs in the mouth or throat 2 (two) times daily. (Patient not taking: Reported on 10/10/2023) 120 mL 0   doxycycline  (VIBRA -TABS) 100 MG tablet Take 1 tablet (100 mg total) by mouth 2 (two) times daily. (Patient not taking: Reported on 10/10/2023) 14 tablet 0   doxycycline  (VIBRA -TABS) 100 MG tablet Take two tablets (200mg ) by mouth as needed within 72 hours after unprotected sex. 60 tablet 2   Estradiol  Valerate 10 MG/ML OIL Inject 0.5 mLs (5 mg total) into the muscle every 14 (fourteen) days. (Patient not taking: Reported on 10/10/2023) 5 mL 0   methocarbamol  (ROBAXIN ) 500 MG tablet Take 1 tablet (500 mg total) by mouth 2 (two) times daily. (Patient not taking: Reported on 10/10/2023) 20 tablet 0   Needles & Syringes MISC 1 Units by Does not apply route every 14 (fourteen) days. (Patient not taking: Reported on 10/10/2023) 30 Units 11   omeprazole  (PRILOSEC) 40 MG capsule Take 1 capsule (40 mg total) by mouth daily. Take 1 capsule 30 minutes prior to breakfast meal each day. (Patient not taking: Reported on 10/10/2023) 30 capsule 2   pantoprazole  (PROTONIX ) 40 MG tablet Take 1 tablet (40 mg total) by mouth daily. (Patient not taking: Reported on 10/10/2023) 30 tablet 3   promethazine -dextromethorphan (PROMETHAZINE -DM) 6.25-15 MG/5ML syrup Take 5 mLs by mouth 3 (three) times daily as needed for cough. (Patient not taking: Reported on 10/10/2023) 200 mL 0   pseudoephedrine  (SUDAFED) 60 MG tablet Take 1 tablet (60 mg total) by mouth every 8 (eight) hours as needed for congestion. (Patient not taking: Reported on 10/10/2023) 30  tablet 0   No facility-administered medications prior to visit.     ROS Review of Systems  Constitutional:  Negative for activity change and appetite change.  HENT:  Negative for sinus pressure and sore throat.   Respiratory:  Negative for chest tightness, shortness of breath and wheezing.   Cardiovascular:  Negative for chest pain and palpitations.  Gastrointestinal:  Negative for abdominal distention, abdominal pain and constipation.  Genitourinary: Negative.   Musculoskeletal: Negative.   Psychiatric/Behavioral:  Negative for behavioral problems and dysphoric mood.     Objective:  BP 138/85 (BP Location: Right Arm, Cuff Size: Normal)   Pulse 60   Wt 207 lb (93.9 kg)   SpO2 96%   BMI 30.57 kg/m      12/04/2023  2:24 PM 12/04/2023    2:03 PM 10/10/2023   10:14 AM  BP/Weight  Systolic BP 138 146 122  Diastolic BP 85 87 82  Wt. (Lbs)  207 204  BMI  30.57 kg/m2 30.13 kg/m2      Physical Exam Constitutional:      Appearance: She is well-developed.  Cardiovascular:     Rate and Rhythm: Normal rate.     Heart sounds: Normal heart sounds. No murmur heard. Pulmonary:     Effort: Pulmonary effort is normal.     Breath sounds: Normal breath sounds. No wheezing or rales.  Chest:     Chest wall: No tenderness.  Abdominal:     General: Bowel sounds are normal. There is no distension.     Palpations: Abdomen is soft. There is no mass.     Tenderness: There is no abdominal tenderness.  Musculoskeletal:        General: Normal range of motion.     Right lower leg: No edema.     Left lower leg: No edema.  Neurological:     Mental Status: She is alert and oriented to person, place, and time.  Psychiatric:        Mood and Affect: Mood normal.        Latest Ref Rng & Units 09/17/2023   10:37 AM 07/24/2022   11:50 AM 01/31/2022   11:57 AM  CMP  Glucose 65 - 99 mg/dL 86  95  87   BUN 7 - 25 mg/dL 10  11  11    Creatinine 0.60 - 1.24 mg/dL 8.95  9.09  8.92   Sodium 135 -  146 mmol/L 139  139  140   Potassium 3.5 - 5.3 mmol/L 3.6  4.2  4.4   Chloride 98 - 110 mmol/L 101  102  101   CO2 20 - 32 mmol/L 29  29  25    Calcium 8.6 - 10.3 mg/dL 9.0  9.3  9.7   Total Protein 6.1 - 8.1 g/dL 7.0  7.7  8.1   Total Bilirubin 0.2 - 1.2 mg/dL 0.7  0.4  0.5   Alkaline Phos 44 - 121 IU/L   66   AST 10 - 40 U/L 21  29  20    ALT 9 - 46 U/L 15  27  20      Lipid Panel     Component Value Date/Time   CHOL 141 09/17/2023 1037   TRIG 64 09/17/2023 1037   HDL 58 09/17/2023 1037   CHOLHDL 2.4 09/17/2023 1037   LDLCALC 69 09/17/2023 1037    CBC    Component Value Date/Time   WBC 4.4 09/17/2023 1037   RBC 4.91 09/17/2023 1037   HGB 14.4 09/17/2023 1037   HGB 12.0 (L) 01/31/2022 1157   HCT 43.8 09/17/2023 1037   HCT 37.1 (L) 01/31/2022 1157   PLT 183 09/17/2023 1037   PLT 239 01/31/2022 1157   MCV 89.2 09/17/2023 1037   MCV 85 01/31/2022 1157   MCH 29.3 09/17/2023 1037   MCHC 32.9 09/17/2023 1037   RDW 13.8 09/17/2023 1037   RDW 13.7 01/31/2022 1157   LYMPHSABS 1,556 07/24/2022 1150   LYMPHSABS 2.0 01/31/2022 1157   MONOABS 0.4 01/03/2022 0511   EOSABS 90 07/24/2022 1150   EOSABS 0.1 01/31/2022 1157   BASOSABS 31 07/24/2022 1150   BASOSABS 0.0 01/31/2022 1157    Lab Results  Component Value Date   HGBA1C 5.3 (A) 12/04/2023  Assessment & Plan 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  Advised that I do not prescribe estrogen. Advised to seek care from [facility] or confirm with the [facility]. Emphasized monitoring for diabetes, kidney, and liver function due to estrogen therapy risks. - Refer to [facility] for estrogen therapy management. - Confirm with the Infect[facility] Disease Clinic for estrogen prescription. - Monitor kidney and liver function regularly.  Diabetes Screening Estrogen therapy  increases diabetes risk. A1c is 5.3, indicating no current diabetes. Continuous monitoring advised. - Perform A1c test to screen for diabetes. - Monitor A1c levels regularly.  General Health Maintenance Reviewed medication list; confirmed no other medications taken. Discussed importance of monitoring kidney and liver function due to estrogen therapy. - Remove unnecessary medications from her list.  Sexually Transmitted Infection (STI) Screening She requested comprehensive STI screening, including chlamydia and other STIs due to new partner.   HIV Currently on antiretroviral therapy Follow-up with infectious disease    No orders of the defined types were placed in this encounter.   Follow-up: Return if symptoms worsen or fail to improve.       Corrina Sabin, MD, FAAFP. [facility] [place], [place] [phone]   12/04/2023, 5:17 PM

## 2023-12-05 ENCOUNTER — Telehealth: Payer: Self-pay

## 2023-12-05 ENCOUNTER — Ambulatory Visit: Payer: Self-pay | Admitting: Family Medicine

## 2023-12-05 LAB — RPR W/REFLEX TO TREPSURE: RPR: REACTIVE — AB

## 2023-12-05 LAB — RPR, QUANT: RPR, Quant: 1:1 {titer} — ABNORMAL HIGH

## 2023-12-05 NOTE — Telephone Encounter (Signed)
 [facility] Patient Advocate Encounter  Patient's medications CABENUVA  have been couriered to [facility] from [facility] and will be administered at the patients appointment on 12/10/23.  Charmaine Sharps, CPhT Specialty Pharmacy Patient [facility] for Infectious Disease Phone: [phone] Fax:  [phone]

## 2023-12-06 LAB — GC/CHLAMYDIA PROBE AMP
Chlamydia trachomatis, NAA: NEGATIVE
Chlamydia trachomatis, NAA: NEGATIVE
Neisseria Gonorrhoeae by PCR: NEGATIVE
Neisseria Gonorrhoeae by PCR: NEGATIVE

## 2023-12-09 NOTE — Progress Notes (Deleted)
 HPI: Joshua Streety. is a 28 y.o. adult who presents to the [facility] pharmacy clinic for Cabenuva  administration.  Patient Active Problem List   Diagnosis Date Noted   Screen for STD (sexually transmitted disease) 06/05/2022   Dental caries 06/05/2022   Syphilis 04/30/2022   Gender identity disorder, unspecified 01/17/2022   Medication monitoring encounter 05/24/2021   Human immunodeficiency virus (HIV) disease (HCC) 04/28/2021    Patient's Medications  New Prescriptions   No medications on file  Previous Medications   CABOTEGRAVIR  & RILPIVIRINE  ER (CABENUVA ) 600 & 900 MG/3ML INJECTION    Inject 1 kit into the muscle every 2 (two) months.   DOXYCYCLINE  (VIBRA -TABS) 100 MG TABLET    Take two tablets (200mg ) by mouth as needed within 72 hours after unprotected sex.   ESTRADIOL  VALERATE 10 MG/ML OIL    Inject 0.5 mLs (5 mg total) into the muscle every 14 (fourteen) days.  Modified Medications   No medications on file  Discontinued Medications   No medications on file    Allergies: No Known Allergies  Labs: Lab Results  Component Value Date   HIV1RNAQUANT NOT DETECTED 09/17/2023   HIV1RNAQUANT Not Detected 12/04/2022   HIV1RNAQUANT Not Detected 07/24/2022   CD4TABS 463 07/24/2022   CD4TABS 817 01/01/2022   CD4TABS 581 08/31/2021    RPR and STI Lab Results  Component Value Date   LABRPR NON-REACTIVE 09/17/2023   LABRPR REACTIVE (A) 03/19/2023   LABRPR REACTIVE (A) 02/12/2023   LABRPR REACTIVE (A) 12/04/2022   LABRPR REACTIVE (A) 09/06/2022   RPRTITER 1:2 (H) 03/19/2023   RPRTITER 1:2 (H) 02/12/2023   RPRTITER 1:4 (H) 12/04/2022   RPRTITER 1:1 (H) 09/06/2022   RPRTITER 1:4 (H) 08/21/2022    STI Results GC CT  Latest Ref Rng & Units  Negative  12/04/2023  3:33 PM  Negative   12/04/2023  3:32 PM  Negative   05/01/2023  3:08 PM Negative    Negative    Negative  Negative    Negative    Negative   03/19/2023 10:10 AM Negative    Negative  Negative    Negative    02/12/2023 10:02 AM Negative    Negative  Negative    Negative   12/04/2022  3:23 PM Negative    Negative    Negative  Positive    Negative    Negative   09/06/2022  1:46 PM Negative    Negative    Negative  Negative    Negative    Negative   08/21/2022 11:05 AM Negative  Negative   08/21/2022 10:12 AM Negative  Negative   07/24/2022 11:29 AM Negative    Negative  Negative    Negative   06/07/2022 10:54 AM Negative    Negative    Negative  Negative    Negative    Negative   05/16/2022 11:32 AM Negative    Negative  Negative    Negative   05/16/2022 11:21 AM Negative  Negative   04/30/2022 11:00 AM Positive    Negative    Negative  Negative    Negative    Negative   03/15/2022  2:55 PM Negative    Negative    Negative  Negative    Negative    Negative   11/16/2021 11:40 AM Negative    Negative    Negative  Negative    Negative    Negative   10/18/2021  3:30 PM Other    Negative  Other    Negative   08/31/2021  9:07 AM Negative    Positive  Negative    Negative   04/28/2021 11:01 AM Negative    Negative  Negative    Negative     Hepatitis B Lab Results  Component Value Date   HEPBSAB REACTIVE (A) 07/24/2022   HEPBSAG NON-REACTIVE 04/28/2021   HEPBCAB NON-REACTIVE 04/28/2021   Hepatitis C Lab Results  Component Value Date   HEPCAB NON-REACTIVE 04/28/2021   Hepatitis A Lab Results  Component Value Date   HAV REACTIVE (A) 04/28/2021   Lipids: Lab Results  Component Value Date   CHOL 141 09/17/2023   TRIG 64 09/17/2023   HDL 58 09/17/2023   CHOLHDL 2.4 09/17/2023   LDLCALC 69 09/17/2023    TARGET DATE: The 22nd  Assessment: Joshua Leon presents today for her maintenance Cabenuva  injections. Past injections were tolerated well without issues. Last HIV RNA was not detected in April. Doing well with no issues today.  Administered cabotegravir  600mg /48mL in left upper outer quadrant of the gluteal muscle. Administered rilpivirine  900 mg/3mL in  the right upper outer quadrant of the gluteal muscle. No issues with injections. She will follow up in 2 months for next set of injections.  Plan: - Cabenuva  injections administered - Next injections scheduled for *** - Call with any issues or questions  Joshua Leon L. Kearstin Learn, PharmD, BCIDP, AAHIVP, CPP Clinical Pharmacist Practitioner - Infectious Diseases Clinical Pharmacist Lead - Specialty Pharmacy [facility] for Infectious Disease 12/09/2023, 2:02 PM

## 2023-12-10 ENCOUNTER — Ambulatory Visit: Admitting: Pharmacist

## 2023-12-10 ENCOUNTER — Telehealth: Payer: Self-pay | Admitting: Internal Medicine

## 2023-12-10 ENCOUNTER — Other Ambulatory Visit: Payer: Self-pay

## 2023-12-10 DIAGNOSIS — F640 Transsexualism: Secondary | ICD-10-CM

## 2023-12-10 DIAGNOSIS — B20 Human immunodeficiency virus [HIV] disease: Secondary | ICD-10-CM

## 2023-12-10 MED ORDER — ESTRADIOL VALERATE 20 MG/ML IM OIL: 5.0000 mg | TOPICAL_OIL | INTRAMUSCULAR | 12 refills | Status: AC

## 2023-12-10 MED ORDER — CABOTEGRAVIR & RILPIVIRINE ER 600 & 900 MG/3ML IM SUER
1.0000 | Freq: Once | INTRAMUSCULAR | Status: AC
  Administered 2023-12-10: 1 via INTRAMUSCULAR

## 2023-12-10 NOTE — Progress Notes (Signed)
 HPI: Joshua Leon. is a 28 y.o. adult who presents to the [facility] pharmacy clinic for Cabenuva  administration.  Patient Active Problem List   Diagnosis Date Noted   Screen for STD (sexually transmitted disease) 06/05/2022   Dental caries 06/05/2022   Syphilis 04/30/2022   Gender identity disorder, unspecified 01/17/2022   Medication monitoring encounter 05/24/2021   Human immunodeficiency virus (HIV) disease (HCC) 04/28/2021    Patient's Medications  New Prescriptions   No medications on file  Previous Medications   CABOTEGRAVIR  & RILPIVIRINE  ER (CABENUVA ) 600 & 900 MG/3ML INJECTION    Inject 1 kit into the muscle every 2 (two) months.   DOXYCYCLINE  (VIBRA -TABS) 100 MG TABLET    Take two tablets (200mg ) by mouth as needed within 72 hours after unprotected sex.   ESTRADIOL  VALERATE 10 MG/ML OIL    Inject 0.5 mLs (5 mg total) into the muscle every 14 (fourteen) days.  Modified Medications   No medications on file  Discontinued Medications   No medications on file    Allergies: No Known Allergies  Labs: Lab Results  Component Value Date   HIV1RNAQUANT NOT DETECTED 09/17/2023   HIV1RNAQUANT Not Detected 12/04/2022   HIV1RNAQUANT Not Detected 07/24/2022   CD4TABS 463 07/24/2022   CD4TABS 817 01/01/2022   CD4TABS 581 08/31/2021    RPR and STI Lab Results  Component Value Date   LABRPR NON-REACTIVE 09/17/2023   LABRPR REACTIVE (A) 03/19/2023   LABRPR REACTIVE (A) 02/12/2023   LABRPR REACTIVE (A) 12/04/2022   LABRPR REACTIVE (A) 09/06/2022   RPRTITER 1:2 (H) 03/19/2023   RPRTITER 1:2 (H) 02/12/2023   RPRTITER 1:4 (H) 12/04/2022   RPRTITER 1:1 (H) 09/06/2022   RPRTITER 1:4 (H) 08/21/2022    STI Results GC CT  Latest Ref Rng & Units  Negative  12/04/2023  3:33 PM  Negative   12/04/2023  3:32 PM  Negative   05/01/2023  3:08 PM Negative    Negative    Negative  Negative    Negative    Negative   03/19/2023 10:10 AM Negative    Negative  Negative    Negative    02/12/2023 10:02 AM Negative    Negative  Negative    Negative   12/04/2022  3:23 PM Negative    Negative    Negative  Positive    Negative    Negative   09/06/2022  1:46 PM Negative    Negative    Negative  Negative    Negative    Negative   08/21/2022 11:05 AM Negative  Negative   08/21/2022 10:12 AM Negative  Negative   07/24/2022 11:29 AM Negative    Negative  Negative    Negative   06/07/2022 10:54 AM Negative    Negative    Negative  Negative    Negative    Negative   05/16/2022 11:32 AM Negative    Negative  Negative    Negative   05/16/2022 11:21 AM Negative  Negative   04/30/2022 11:00 AM Positive    Negative    Negative  Negative    Negative    Negative   03/15/2022  2:55 PM Negative    Negative    Negative  Negative    Negative    Negative   11/16/2021 11:40 AM Negative    Negative    Negative  Negative    Negative    Negative   10/18/2021  3:30 PM Other    Negative  Other    Negative   08/31/2021  9:07 AM Negative    Positive  Negative    Negative   04/28/2021 11:01 AM Negative    Negative  Negative    Negative     Hepatitis B Lab Results  Component Value Date   HEPBSAB REACTIVE (A) 07/24/2022   HEPBSAG NON-REACTIVE 04/28/2021   HEPBCAB NON-REACTIVE 04/28/2021   Hepatitis C Lab Results  Component Value Date   HEPCAB NON-REACTIVE 04/28/2021   Hepatitis A Lab Results  Component Value Date   HAV REACTIVE (A) 04/28/2021   Lipids: Lab Results  Component Value Date   CHOL 141 09/17/2023   TRIG 64 09/17/2023   HDL 58 09/17/2023   CHOLHDL 2.4 09/17/2023   LDLCALC 69 09/17/2023    TARGET DATE: The 22nd  Assessment: [name] presents today for her maintenance Cabenuva  injections. Past injections were tolerated well without issues. Last HIV RNA was not detected in April. Doing well with no issues today. Requesting to resend Estrogen Rx to [facility].  Administered cabotegravir  600mg /41mL in left upper outer quadrant of the gluteal  muscle. Administered rilpivirine  900 mg/3mL in the right upper outer quadrant of the gluteal muscle. No issues with injections. She will follow up in 2 months for next set of injections.  Plan: - Cabenuva  injections administered - Next injections scheduled for 02/11/24 and 04/14/24 with Alan - Call with any issues or questions  Zeven Kocak L. Cassady Stanczak, PharmD, BCIDP, AAHIVP, CPP Clinical Pharmacist Practitioner - Infectious Diseases Clinical Pharmacist Lead - Specialty Pharmacy [facility] for Infectious Disease 12/10/2023, 11:10 AM

## 2023-12-10 NOTE — Telephone Encounter (Signed)
 Avenir inquired about her estrogen injections. She asked do we have a doctor to send off prescriptions because she has been having a hard time getting it filled. It was once managed by Dr. Efrain. Pt can be reached at [phone].

## 2023-12-10 NOTE — Telephone Encounter (Signed)
 I already took care of this.  - Corrado Hymon

## 2024-01-28 ENCOUNTER — Other Ambulatory Visit: Payer: Self-pay

## 2024-01-28 ENCOUNTER — Other Ambulatory Visit (HOSPITAL_COMMUNITY): Payer: Self-pay

## 2024-01-28 NOTE — Progress Notes (Signed)
 Specialty Pharmacy Refill Coordination Note  Joshua Leon. is a 28 y.o. adult assessed today regarding refills of clinic administered specialty medication(s) Cabotegravir  & Rilpivirine  (CABENUVA )   Clinic requested Courier to Provider Office   Delivery date: 02/06/24   Verified address: [address] [place] [address]   Medication will be filled on 02/05/24.

## 2024-02-05 ENCOUNTER — Other Ambulatory Visit: Payer: Self-pay

## 2024-02-06 ENCOUNTER — Telehealth: Payer: Self-pay

## 2024-02-06 NOTE — Telephone Encounter (Signed)
 [facility] Patient Advocate Encounter  Patient's medications CABENUVA  have been couriered to [facility] from [facility] and will be administered at the patients appointment on 02/19/24.  Charmaine Sharps, CPhT Specialty Pharmacy Patient [facility] for Infectious Disease Phone: [phone] Fax:  [phone]

## 2024-02-11 ENCOUNTER — Other Ambulatory Visit: Payer: Self-pay

## 2024-02-11 ENCOUNTER — Ambulatory Visit: Admitting: Pharmacist

## 2024-02-11 ENCOUNTER — Other Ambulatory Visit (HOSPITAL_COMMUNITY)
Admission: RE | Admit: 2024-02-11 | Discharge: 2024-02-11 | Disposition: A | Discharge status: Home / Self Care | Source: Ambulatory Visit | Attending: Infectious Disease | Admitting: Infectious Disease

## 2024-02-11 DIAGNOSIS — Z113 Encounter for screening for infections with a predominantly sexual mode of transmission: Secondary | ICD-10-CM

## 2024-02-11 DIAGNOSIS — B20 Human immunodeficiency virus [HIV] disease: Secondary | ICD-10-CM | POA: Diagnosis present

## 2024-02-11 MED ORDER — DOXYCYCLINE HYCLATE 100 MG PO TABS: ORAL_TABLET | ORAL | 2 refills | Status: DC

## 2024-02-11 MED ORDER — CABOTEGRAVIR & RILPIVIRINE ER 600 & 900 MG/3ML IM SUER
1.0000 | Freq: Once | INTRAMUSCULAR | Status: AC
  Administered 2024-02-11: 1 via INTRAMUSCULAR

## 2024-02-11 NOTE — Progress Notes (Signed)
 HPI: Joshua Leon. is a 28 y.o. adult who presents to the [facility] pharmacy clinic for Cabenuva  administration.  Referring ID Physician: Dr. Dennise  Patient Active Problem List   Diagnosis Date Noted   Screen for STD (sexually transmitted disease) 06/05/2022   Dental caries 06/05/2022   Syphilis 04/30/2022   Gender identity disorder, unspecified 01/17/2022   Medication monitoring encounter 05/24/2021   Human immunodeficiency virus (HIV) disease (HCC) 04/28/2021    Patient's Medications  New Prescriptions   No medications on file  Previous Medications   CABOTEGRAVIR  & RILPIVIRINE  ER (CABENUVA ) 600 & 900 MG/3ML INJECTION    Inject 1 kit into the muscle every 2 (two) months.   ESTRADIOL  VALERATE (DELESTROGEN ) 20 MG/ML INJECTION    Inject 0.25 mLs (5 mg total) into the muscle every 14 (fourteen) days.  Modified Medications   Modified Medication Previous Medication   DOXYCYCLINE  (VIBRA -TABS) 100 MG TABLET doxycycline  (VIBRA -TABS) 100 MG tablet      Take two tablets (200mg ) by mouth as needed within 72 hours after unprotected sex.    Take two tablets (200mg ) by mouth as needed within 72 hours after unprotected sex.  Discontinued Medications   No medications on file    Allergies: No Known Allergies  Past Medical History: Past Medical History:  Diagnosis Date   COVID-19 virus infection 01/01/2022   Mallory-Weiss tear    Open fracture of tooth 06/05/2022   Pneumonia     Social History: Social History   Socioeconomic History   Marital status: Single    Spouse name: Not on file   Number of children: Not on file   Years of education: Not on file   Highest education level: Not on file  Occupational History   Not on file  Tobacco Use   Smoking status: Former    Types: Cigars   Smokeless tobacco: Never  Vaping Use   Vaping status: Never Used  Substance and Sexual Activity   Alcohol use: Yes    Comment: ocassionally   Drug use: Yes    Types: Marijuana   Sexual  activity: Yes    Partners: Female, Male  Other Topics Concern   Not on file  Social History Narrative   Not on file   Social Drivers of Health   Financial Resource Strain: Medium Risk (05/07/2023)   Overall Financial Resource Strain (CARDIA)    Difficulty of Paying Living Expenses: Somewhat hard  Food Insecurity: Food Insecurity Present (05/07/2023)   Hunger Vital Sign    Worried About Running Out of Food in the Last Year: Never true    Ran Out of Food in the Last Year: Sometimes true  Transportation Needs: No Transportation Needs (05/07/2023)   PRAPARE - Administrator, Civil Service (Medical): No    Lack of Transportation (Non-Medical): No  Physical Activity: Not on file  Stress: No Stress Concern Present (05/07/2023)   [employer] of Occupational Health - Occupational Stress Questionnaire    Feeling of Stress : Not at all  Social Connections: Unknown (05/07/2023)   Social Connection and Isolation Panel    Frequency of Communication with Friends and Family: Once a week    Frequency of Social Gatherings with Friends and Family: Three times a week    Attends Religious Services: Never    Active Member of Clubs or Organizations: No    Attends Banker Meetings: Never    Marital Status: Not on file    Labs: Lab Results  Component Value Date   HIV1RNAQUANT NOT DETECTED 09/17/2023   HIV1RNAQUANT Not Detected 12/04/2022   HIV1RNAQUANT Not Detected 07/24/2022   CD4TABS 463 07/24/2022   CD4TABS 817 01/01/2022   CD4TABS 581 08/31/2021    RPR and STI Lab Results  Component Value Date   LABRPR NON-REACTIVE 09/17/2023   LABRPR REACTIVE (A) 03/19/2023   LABRPR REACTIVE (A) 02/12/2023   LABRPR REACTIVE (A) 12/04/2022   LABRPR REACTIVE (A) 09/06/2022   RPRTITER 1:2 (H) 03/19/2023   RPRTITER 1:2 (H) 02/12/2023   RPRTITER 1:4 (H) 12/04/2022   RPRTITER 1:1 (H) 09/06/2022   RPRTITER 1:4 (H) 08/21/2022    STI Results GC CT  Latest Ref Rng & Units   Negative  12/04/2023  3:33 PM  Negative   12/04/2023  3:32 PM  Negative   05/01/2023  3:08 PM Negative    Negative    Negative  Negative    Negative    Negative   03/19/2023 10:10 AM Negative    Negative  Negative    Negative   02/12/2023 10:02 AM Negative    Negative  Negative    Negative   12/04/2022  3:23 PM Negative    Negative    Negative  Positive    Negative    Negative   09/06/2022  1:46 PM Negative    Negative    Negative  Negative    Negative    Negative   08/21/2022 11:05 AM Negative  Negative   08/21/2022 10:12 AM Negative  Negative   07/24/2022 11:29 AM Negative    Negative  Negative    Negative   06/07/2022 10:54 AM Negative    Negative    Negative  Negative    Negative    Negative   05/16/2022 11:32 AM Negative    Negative  Negative    Negative   05/16/2022 11:21 AM Negative  Negative   04/30/2022 11:00 AM Positive    Negative    Negative  Negative    Negative    Negative   03/15/2022  2:55 PM Negative    Negative    Negative  Negative    Negative    Negative   11/16/2021 11:40 AM Negative    Negative    Negative  Negative    Negative    Negative   10/18/2021  3:30 PM Other    Negative  Other    Negative   08/31/2021  9:07 AM Negative    Positive  Negative    Negative   04/28/2021 11:01 AM Negative    Negative  Negative    Negative     Hepatitis B Lab Results  Component Value Date   HEPBSAB REACTIVE (A) 07/24/2022   HEPBSAG NON-REACTIVE 04/28/2021   HEPBCAB NON-REACTIVE 04/28/2021   Hepatitis C Lab Results  Component Value Date   HEPCAB NON-REACTIVE 04/28/2021   Hepatitis A Lab Results  Component Value Date   HAV REACTIVE (A) 04/28/2021   Lipids: Lab Results  Component Value Date   CHOL 141 09/17/2023   TRIG 64 09/17/2023   HDL 58 09/17/2023   CHOLHDL 2.4 09/17/2023   LDLCALC 69 09/17/2023    TARGET DATE:  The 22nd of the month  Assessment: Kongmeng presents today for their maintenance Cabenuva  injections.  Initial/past injections were tolerated well without issues. No problems with systemic effects of injections.   Administered cabotegravir  600mg /68mL in left upper outer quadrant of the gluteal muscle. Administered rilpivirine  900 mg/3mL in the right upper outer  quadrant of the gluteal muscle. Monitored patient for 10 minutes after injection. Injections were tolerated well without issue. Patient will follow up in 2 months for next injection. Will defer HIV RNA until next visit.  Eligible for flu vaccine; will defer until next visit. Requesting syringes/needles for Delestrogen  today which were provided. Also requests refills on doxyPEP which were sent.   Plan: - Cabenuva  injections administered - Check RPR and urine/oral/rectal cytologies - Next injections scheduled for 11/18 with me  - Call with any issues or questions  Alan Geralds, PharmD, CPP, BCIDP, AAHIVP Clinical Pharmacist Practitioner Infectious Diseases Clinical Pharmacist [facility]

## 2024-02-13 ENCOUNTER — Ambulatory Visit: Admitting: Pharmacist

## 2024-02-13 LAB — RPR: RPR Ser Ql: NONREACTIVE

## 2024-02-13 LAB — URINE CYTOLOGY ANCILLARY ONLY
Chlamydia: NEGATIVE
Comment: NEGATIVE
Comment: NORMAL
Neisseria Gonorrhea: NEGATIVE

## 2024-02-13 LAB — CYTOLOGY, (ORAL, ANAL, URETHRAL) ANCILLARY ONLY
Chlamydia: NEGATIVE
Chlamydia: NEGATIVE
Comment: NEGATIVE
Comment: NEGATIVE
Comment: NORMAL
Comment: NORMAL
Neisseria Gonorrhea: POSITIVE — AB
Neisseria Gonorrhea: POSITIVE — AB

## 2024-02-13 NOTE — Progress Notes (Unsigned)
   HPI: Joshua Leon. is a 28 y.o. adult who presents to the [facility] clinic today for STI treatment.  Patient Active Problem List   Diagnosis Date Noted   Screen for STD (sexually transmitted disease) 06/05/2022   Dental caries 06/05/2022   Syphilis 04/30/2022   Gender identity disorder, unspecified 01/17/2022   Medication monitoring encounter 05/24/2021   Human immunodeficiency virus (HIV) disease (HCC) 04/28/2021    Patient's Medications  New Prescriptions   No medications on file  Previous Medications   CABOTEGRAVIR  & RILPIVIRINE  ER (CABENUVA ) 600 & 900 MG/3ML INJECTION    Inject 1 kit into the muscle every 2 (two) months.   DOXYCYCLINE  (VIBRA -TABS) 100 MG TABLET    Take two tablets (200mg ) by mouth as needed within 72 hours after unprotected sex.   ESTRADIOL  VALERATE (DELESTROGEN ) 20 MG/ML INJECTION    Inject 0.25 mLs (5 mg total) into the muscle every 14 (fourteen) days.  Modified Medications   No medications on file  Discontinued Medications   No medications on file    Assessment: Joshua Leon presents to clinic today to receive treatment for gonorrhea. She got STI screening at her visit for Cabenuva  injections three days ago that resulted positive for oral and rectal gonorrhea. She denies any symptoms. Counseled on safe sex practices. Instructed her to avoid sex for 7 days. Administered ceftriaxone  500 mg IM x1. Since one of the positive sources is oral, will need to follow up in 10-14 days for test of cure.   Plan: - Administered ceftriaxone  500 mg IM x1 in the right gluteal muscle - Test of cure follow-up visit scheduled for 02/27/24 with Joshua Leon - Call with any questions/concerns  Joshua Leon, PharmD PGY1 Pharmacy Resident

## 2024-02-14 ENCOUNTER — Other Ambulatory Visit: Payer: Self-pay

## 2024-02-14 ENCOUNTER — Ambulatory Visit (INDEPENDENT_AMBULATORY_CARE_PROVIDER_SITE_OTHER): Admitting: Pharmacist

## 2024-02-14 DIAGNOSIS — A546 Gonococcal infection of anus and rectum: Secondary | ICD-10-CM | POA: Diagnosis not present

## 2024-02-14 DIAGNOSIS — A545 Gonococcal pharyngitis: Secondary | ICD-10-CM | POA: Diagnosis present

## 2024-02-14 DIAGNOSIS — A549 Gonococcal infection, unspecified: Secondary | ICD-10-CM

## 2024-02-14 MED ORDER — CEFTRIAXONE SODIUM 500 MG IJ SOLR
500.0000 mg | Freq: Once | INTRAMUSCULAR | Status: AC
  Administered 2024-02-14: 500 mg via INTRAMUSCULAR

## 2024-02-27 ENCOUNTER — Ambulatory Visit: Admitting: Pharmacist

## 2024-04-02 ENCOUNTER — Other Ambulatory Visit (HOSPITAL_COMMUNITY): Payer: Self-pay

## 2024-04-02 ENCOUNTER — Other Ambulatory Visit: Payer: Self-pay

## 2024-04-02 NOTE — Progress Notes (Signed)
 Specialty Pharmacy Refill Coordination Note  Joshua Pound. is a 28 y.o. adult assessed today regarding refills of clinic administered specialty medication(s) Cabotegravir  & Rilpivirine  (CABENUVA )   Clinic requested Courier to Provider Office   Delivery date: 04/13/24   Verified address: [address] [place] [address]   Medication will be filled on 04/10/24.

## 2024-04-08 ENCOUNTER — Telehealth: Payer: Self-pay

## 2024-04-08 ENCOUNTER — Other Ambulatory Visit (HOSPITAL_COMMUNITY): Payer: Self-pay

## 2024-04-08 NOTE — Telephone Encounter (Signed)
 Pharmacy Patient Advocate Encounter   Received notification from [employer] that prior authorization for DOXYCYCLINE  is required/requested.   Insurance verification completed.   The patient is insured through [employer].   Per test claim: PA required; PA submitted to above mentioned insurance via [employer] Key/confirmation #/EOC [id number] Status is pending

## 2024-04-09 NOTE — Telephone Encounter (Signed)
 Pharmacy Patient Advocate Encounter  Received notification from [employer] that Prior Authorization for DOXYCYCLINE  has been DENIED.  Full denial letter will be uploaded to the media tab. See denial reason below.   PA #/Case ID/Reference #: [id number]

## 2024-04-09 NOTE — Telephone Encounter (Signed)
 This is truly ridiculous. Thanks for sending the paper to me. I will speak with patient during appointment next week and resend different formulation.

## 2024-04-10 ENCOUNTER — Other Ambulatory Visit: Payer: Self-pay

## 2024-04-10 NOTE — Telephone Encounter (Signed)
 Insurance preferred Capsule instead of tablets

## 2024-04-13 NOTE — Progress Notes (Unsigned)
 HPI: Joshua Early. is a 28 y.o. adult who presents to the [facility] pharmacy clinic for Cabenuva  administration.  Referring ID Provider: Dr. Fleeta Rothman   Patient Active Problem List   Diagnosis Date Noted   Screen for STD (sexually transmitted disease) 06/05/2022   Dental caries 06/05/2022   Syphilis 04/30/2022   Gender identity disorder, unspecified 01/17/2022   Medication monitoring encounter 05/24/2021   Human immunodeficiency virus (HIV) disease (HCC) 04/28/2021    Patient's Medications  New Prescriptions   No medications on file  Previous Medications   CABOTEGRAVIR  & RILPIVIRINE  ER (CABENUVA ) 600 & 900 MG/3ML INJECTION    Inject 1 kit into the muscle every 2 (two) months.   DOXYCYCLINE  (VIBRA -TABS) 100 MG TABLET    Take two tablets (200mg ) by mouth as needed within 72 hours after unprotected sex.   ESTRADIOL  VALERATE (DELESTROGEN ) 20 MG/ML INJECTION    Inject 0.25 mLs (5 mg total) into the muscle every 14 (fourteen) days.  Modified Medications   No medications on file  Discontinued Medications   No medications on file    Allergies: No Known Allergies  Labs: Lab Results  Component Value Date   HIV1RNAQUANT NOT DETECTED 09/17/2023   HIV1RNAQUANT Not Detected 12/04/2022   HIV1RNAQUANT Not Detected 07/24/2022   CD4TABS 463 07/24/2022   CD4TABS 817 01/01/2022   CD4TABS 581 08/31/2021    RPR and STI Lab Results  Component Value Date   LABRPR NON-REACTIVE 02/11/2024   LABRPR NON-REACTIVE 09/17/2023   LABRPR REACTIVE (A) 03/19/2023   LABRPR REACTIVE (A) 02/12/2023   LABRPR REACTIVE (A) 12/04/2022   RPRTITER 1:2 (H) 03/19/2023   RPRTITER 1:2 (H) 02/12/2023   RPRTITER 1:4 (H) 12/04/2022   RPRTITER 1:1 (H) 09/06/2022   RPRTITER 1:4 (H) 08/21/2022    STI Results GC CT  02/11/2024  3:44 PM Negative    Positive    Positive  Negative    Negative    Negative   12/04/2023  3:33 PM  Negative   12/04/2023  3:32 PM  Negative   05/01/2023  3:08 PM Negative     Negative    Negative  Negative    Negative    Negative   03/19/2023 10:10 AM Negative    Negative  Negative    Negative   02/12/2023 10:02 AM Negative    Negative  Negative    Negative   12/04/2022  3:23 PM Negative    Negative    Negative  Positive    Negative    Negative   09/06/2022  1:46 PM Negative    Negative    Negative  Negative    Negative    Negative   08/21/2022 11:05 AM Negative  Negative   08/21/2022 10:12 AM Negative  Negative   07/24/2022 11:29 AM Negative    Negative  Negative    Negative   06/07/2022 10:54 AM Negative    Negative    Negative  Negative    Negative    Negative   05/16/2022 11:32 AM Negative    Negative  Negative    Negative   05/16/2022 11:21 AM Negative  Negative   04/30/2022 11:00 AM Positive    Negative    Negative  Negative    Negative    Negative   03/15/2022  2:55 PM Negative    Negative    Negative  Negative    Negative    Negative   11/16/2021 11:40 AM Negative    Negative  Negative  Negative    Negative    Negative   10/18/2021  3:30 PM Other    Negative  Other    Negative   08/31/2021  9:07 AM Negative    Positive  Negative    Negative   04/28/2021 11:01 AM Negative    Negative  Negative    Negative     Hepatitis B Lab Results  Component Value Date   HEPBSAB REACTIVE (A) 07/24/2022   HEPBSAG NON-REACTIVE 04/28/2021   HEPBCAB NON-REACTIVE 04/28/2021   Hepatitis C Lab Results  Component Value Date   HEPCAB NON-REACTIVE 04/28/2021   Hepatitis A Lab Results  Component Value Date   HAV REACTIVE (A) 04/28/2021   Lipids: Lab Results  Component Value Date   CHOL 141 09/17/2023   TRIG 64 09/17/2023   HDL 58 09/17/2023   CHOLHDL 2.4 09/17/2023   LDLCALC 69 09/17/2023    Target Date: 22nd  Assessment: Joshua Leon presents today for her maintenance Cabenuva  injections. Past injections were tolerated well without issues. Last HIV RNA was undetectable on 09/17/2023. Patient reports having a cold for the  past week and requested COVID testing today. Unfortunately, we do not rapid COVID tests but I informed the patient to consider seeing her PCP or an urgent care if symptoms worsen or do not improve. She reports feeling better overall with symptoms consisting of headache and congestion.   Patient asking for smaller syringes for estradiol  injections. Demonstrated how to measure 0.25 mL for estradiol  dose and syringes provided. Patient also requesting refills on DoxyPEP and appears insurance prefers capsule formulation, therefore, will send in refills to preferred pharmacy. Also asked for MyChart username which was provided to patient.   Of note, patient plans to move to [place]  sometime in February. Informed that patient should start looking for providers near her new home in [place]  that offers Cabenuva  injections.   Lab work:  HIV RNA, oral/urine/rectal cytologies for GC/Chlamydia, RPR   Eligible vaccinations: Influenza, Covid, Shingrix 1/2 - Patient politely declines vaccine today due to having a cold for the past few days  Cabenuva : Administered cabotegravir  600mg /3mL in left upper outer quadrant of the gluteal muscle. Administered rilpivirine  900 mg/3mL in the right upper outer quadrant of the gluteal muscle. No issues with injections. Joshua Leon will follow up in 2 months for next set of injections.  Plan: - Cabenuva  injections administered - HIV RNA, oral/urine/rectal cytologies for GC/Chlamydia, RPR  - Next injections scheduled for 06/11/2024 with Alan, then 08/11/2024 with Alan - DoxyPEP capsule prescription sent to preferred pharmacy  - Call with any issues or questions  Feliciano Close, PharmD PGY2 Infectious Diseases Pharmacy Resident

## 2024-04-14 ENCOUNTER — Ambulatory Visit (INDEPENDENT_AMBULATORY_CARE_PROVIDER_SITE_OTHER): Payer: Self-pay | Admitting: Pharmacist

## 2024-04-14 ENCOUNTER — Other Ambulatory Visit: Payer: Self-pay

## 2024-04-14 DIAGNOSIS — B20 Human immunodeficiency virus [HIV] disease: Secondary | ICD-10-CM

## 2024-04-14 DIAGNOSIS — Z113 Encounter for screening for infections with a predominantly sexual mode of transmission: Secondary | ICD-10-CM

## 2024-04-14 DIAGNOSIS — Z202 Contact with and (suspected) exposure to infections with a predominantly sexual mode of transmission: Secondary | ICD-10-CM

## 2024-04-14 MED ORDER — DOXYCYCLINE HYCLATE 100 MG PO CAPS: ORAL_CAPSULE | ORAL | 3 refills | Status: AC

## 2024-04-14 MED ORDER — CABOTEGRAVIR & RILPIVIRINE ER 600 & 900 MG/3ML IM SUER
1.0000 | Freq: Once | INTRAMUSCULAR | Status: AC
  Administered 2024-04-14: 1 via INTRAMUSCULAR

## 2024-04-15 LAB — GC/CHLAMYDIA PROBE, AMP (THROAT)
Chlamydia trachomatis RNA: NOT DETECTED
Neisseria gonorrhoeae RNA: NOT DETECTED

## 2024-04-15 LAB — CT/NG RNA, TMA RECTAL
Chlamydia Trachomatis RNA: NOT DETECTED
Neisseria Gonorrhoeae RNA: NOT DETECTED

## 2024-04-16 LAB — HIV-1 RNA QUANT-NO REFLEX-BLD
HIV 1 RNA Quant: NOT DETECTED {copies}/mL
HIV-1 RNA Quant, Log: NOT DETECTED {Log_copies}/mL

## 2024-04-16 LAB — C. TRACHOMATIS/N. GONORRHOEAE RNA
C. trachomatis RNA, TMA: NOT DETECTED
N. gonorrhoeae RNA, TMA: NOT DETECTED

## 2024-04-16 LAB — RPR: RPR Ser Ql: NONREACTIVE

## 2024-06-09 ENCOUNTER — Other Ambulatory Visit: Payer: Self-pay | Admitting: Pharmacist

## 2024-06-09 DIAGNOSIS — Z113 Encounter for screening for infections with a predominantly sexual mode of transmission: Secondary | ICD-10-CM

## 2024-06-10 NOTE — Progress Notes (Signed)
 "  HPI: Joshua Leon. is a 29 y.o. adult who presents to the [facility] pharmacy clinic for Cabenuva  administration.  Referring ID Provider: Dr. Fleeta Leon  Patient Active Problem List   Diagnosis Date Noted   Screen for STD (sexually transmitted disease) 06/05/2022   Dental caries 06/05/2022   Syphilis 04/30/2022   Gender identity disorder, unspecified 01/17/2022   Medication monitoring encounter 05/24/2021   Human immunodeficiency virus (HIV) disease (HCC) 04/28/2021    Patient's Medications  New Prescriptions   No medications on file  Previous Medications   CABOTEGRAVIR  & RILPIVIRINE  ER (CABENUVA ) 600 & 900 MG/3ML INJECTION    Inject 1 kit into the muscle every 2 (two) months.   DOXYCYCLINE  (VIBRAMYCIN ) 100 MG CAPSULE    Take 2 capsules (200 mg) by mouth as soon as possible after condomless sex but no later than 72 hours after condomless sex. If you have sex again within 24 hours of taking doxycycline , take another dose 24 hours after your last dose.   ESTRADIOL  VALERATE (DELESTROGEN ) 20 MG/ML INJECTION    Inject 0.25 mLs (5 mg total) into the muscle every 14 (fourteen) days.  Modified Medications   No medications on file  Discontinued Medications   No medications on file    Allergies: Allergies[1]  Labs: Lab Results  Component Value Date   HIV1RNAQUANT NOT DETECTED 04/14/2024   HIV1RNAQUANT NOT DETECTED 09/17/2023   HIV1RNAQUANT Not Detected 12/04/2022   CD4TABS 463 07/24/2022   CD4TABS 817 01/01/2022   CD4TABS 581 08/31/2021    RPR and STI Lab Results  Component Value Date   LABRPR NON-REACTIVE 04/14/2024   LABRPR NON-REACTIVE 02/11/2024   LABRPR NON-REACTIVE 09/17/2023   LABRPR REACTIVE (A) 03/19/2023   LABRPR REACTIVE (A) 02/12/2023   RPRTITER 1:2 (H) 03/19/2023   RPRTITER 1:2 (H) 02/12/2023   RPRTITER 1:4 (H) 12/04/2022   RPRTITER 1:1 (H) 09/06/2022   RPRTITER 1:4 (H) 08/21/2022    STI Results GC CT  02/11/2024  3:44 PM Negative    Positive     Positive  Negative    Negative    Negative   12/04/2023  3:33 PM  Negative   12/04/2023  3:32 PM  Negative   05/01/2023  3:08 PM Negative    Negative    Negative  Negative    Negative    Negative   03/19/2023 10:10 AM Negative    Negative  Negative    Negative   02/12/2023 10:02 AM Negative    Negative  Negative    Negative   12/04/2022  3:23 PM Negative    Negative    Negative  Positive    Negative    Negative   09/06/2022  1:46 PM Negative    Negative    Negative  Negative    Negative    Negative   08/21/2022 11:05 AM Negative  Negative   08/21/2022 10:12 AM Negative  Negative   07/24/2022 11:29 AM Negative    Negative  Negative    Negative   06/07/2022 10:54 AM Negative    Negative    Negative  Negative    Negative    Negative   05/16/2022 11:32 AM Negative    Negative  Negative    Negative   05/16/2022 11:21 AM Negative  Negative   04/30/2022 11:00 AM Positive    Negative    Negative  Negative    Negative    Negative   03/15/2022  2:55 PM Negative  Negative    Negative  Negative    Negative    Negative   11/16/2021 11:40 AM Negative    Negative    Negative  Negative    Negative    Negative   10/18/2021  3:30 PM Other    Negative  Other    Negative   08/31/2021  9:07 AM Negative    Positive  Negative    Negative   04/28/2021 11:01 AM Negative    Negative  Negative    Negative     Hepatitis B Lab Results  Component Value Date   HEPBSAB REACTIVE (A) 07/24/2022   HEPBSAG NON-REACTIVE 04/28/2021   HEPBCAB NON-REACTIVE 04/28/2021   Hepatitis C Lab Results  Component Value Date   HEPCAB NON-REACTIVE 04/28/2021   Hepatitis A Lab Results  Component Value Date   HAV REACTIVE (A) 04/28/2021   Lipids: Lab Results  Component Value Date   CHOL 141 09/17/2023   TRIG 64 09/17/2023   HDL 58 09/17/2023   CHOLHDL 2.4 09/17/2023   LDLCALC 69 09/17/2023    Target Date: 22nd  Assessment: Joshua Leon presents today for her maintenance Cabenuva   injections. Past injections were tolerated well without issues. Last HIV RNA was undetectable in November.   She requests STI testing and states she has had some anal/rectal discomfort and recent sexual activity. She also would like to be empirically treated for STIs today given her symptoms. Will administer ceftriaxone  for gonorrhea today.   She asks for refills of her DoxyPEP and estradiol . Looking at the records, there were still several fills available at [facility] on both. I told her there should be refills left on both and to let us  know if there were issues.   Lab work:  STI testing today including oral, rectal, urine, and RPR tests  Eligible vaccinations:  Flu, COVID, Shingrix; politely declines today but says she will think about getting the shingles vaccine. I provided her with a paper script for the shingles vaccine and told her she could take it to any pharmacy to have it administered if she would like to receive it.   Cabenuva : Administered cabotegravir  600mg /18mL in left upper outer quadrant of the gluteal muscle. Administered rilpivirine  900 mg/3mL in the right upper outer quadrant of the gluteal muscle. No issues with injections. Joshua Leon will follow up in 2 months for next set of injections.  Plan: - Cabenuva  injections administered - STI testing today; empiric ceftriaxone  500mg  IM administered - Will provide doxycycline  script if positive for chlamydia - Provided prescription for Shingles vaccine - Next injections scheduled for 08/11/24 - Call with any issues or questions  Maurilio Patten, PharmD PGY1 Pharmacy Resident [facility] 06/10/2024 10:08 PM     [1] No Known Allergies  "

## 2024-06-10 NOTE — Telephone Encounter (Signed)
 Patient should have refills on file.   Brantleigh Mifflin, BSN, RN

## 2024-06-11 ENCOUNTER — Ambulatory Visit: Admitting: Pharmacist

## 2024-06-11 ENCOUNTER — Other Ambulatory Visit: Payer: Self-pay

## 2024-06-11 ENCOUNTER — Other Ambulatory Visit (HOSPITAL_COMMUNITY)
Admission: RE | Admit: 2024-06-11 | Discharge: 2024-06-11 | Disposition: A | Discharge status: Home / Self Care | Source: Ambulatory Visit | Attending: Infectious Disease | Admitting: Infectious Disease

## 2024-06-11 ENCOUNTER — Other Ambulatory Visit (HOSPITAL_COMMUNITY): Payer: Self-pay

## 2024-06-11 DIAGNOSIS — Z113 Encounter for screening for infections with a predominantly sexual mode of transmission: Secondary | ICD-10-CM | POA: Insufficient documentation

## 2024-06-11 DIAGNOSIS — A549 Gonococcal infection, unspecified: Secondary | ICD-10-CM | POA: Diagnosis not present

## 2024-06-11 DIAGNOSIS — B20 Human immunodeficiency virus [HIV] disease: Secondary | ICD-10-CM

## 2024-06-11 DIAGNOSIS — Z23 Encounter for immunization: Secondary | ICD-10-CM

## 2024-06-11 MED ORDER — ZOSTER VAC RECOMB ADJUVANTED 50 MCG/0.5ML IM SUSR: INTRAMUSCULAR | 1 refills | Status: AC

## 2024-06-11 MED ORDER — CABOTEGRAVIR & RILPIVIRINE ER 600 & 900 MG/3ML IM SUER
1.0000 | Freq: Once | INTRAMUSCULAR | Status: AC
  Administered 2024-06-11: 1 via INTRAMUSCULAR

## 2024-06-11 MED ORDER — CEFTRIAXONE SODIUM 500 MG IJ SOLR
500.0000 mg | Freq: Once | INTRAMUSCULAR | Status: AC
  Administered 2024-06-11: 500 mg via INTRAMUSCULAR

## 2024-06-11 MED ORDER — ZOSTER VAC RECOMB ADJUVANTED 50 MCG/0.5ML IM SUSR: INTRAMUSCULAR | 0 refills | Status: DC

## 2024-06-11 NOTE — Progress Notes (Signed)
 Joshua Leon

## 2024-06-11 NOTE — Progress Notes (Signed)
 Specialty Pharmacy Refill Coordination Note  Kalim Kissel. is a 29 y.o. adult assessed today regarding refills of clinic administered specialty medication(s) Cabotegravir  & Rilpivirine  (CABENUVA )   Clinic requested Courier to Provider Office   Delivery date: 06/15/24   Verified address: [address] [id number]   Medication will be filled on 06/12/24.

## 2024-06-12 ENCOUNTER — Ambulatory Visit: Payer: Self-pay | Admitting: Pharmacist

## 2024-06-12 ENCOUNTER — Other Ambulatory Visit: Payer: Self-pay

## 2024-06-12 LAB — CYTOLOGY, (ORAL, ANAL, URETHRAL) ANCILLARY ONLY
Chlamydia: NEGATIVE
Chlamydia: NEGATIVE
Comment: NEGATIVE
Comment: NEGATIVE
Comment: NORMAL
Comment: NORMAL
Neisseria Gonorrhea: NEGATIVE
Neisseria Gonorrhea: POSITIVE — AB

## 2024-06-12 LAB — SYPHILIS: RPR W/REFLEX TO RPR TITER AND TREPONEMAL ANTIBODIES, TRADITIONAL SCREENING AND DIAGNOSIS ALGORITHM: RPR Ser Ql: NONREACTIVE

## 2024-06-15 ENCOUNTER — Telehealth: Payer: Self-pay

## 2024-06-15 NOTE — Telephone Encounter (Signed)
 [facility] Patient Advocate Encounter  Patient's medications Cabenuva  have been couriered to [facility] from [facility] and will be administered at the patients appointment on 06/11/24.  Arland Hutchinson, CPhT Specialty Pharmacy Patient [facility] for Infectious Disease Phone: [phone] Fax:  [phone]

## 2024-08-11 ENCOUNTER — Ambulatory Visit: Admitting: Pharmacist
